# Patient Record
Sex: Male | Born: 1970 | Race: White | Hispanic: No | Marital: Single | Smoking: Current every day smoker
Health system: Southern US, Community
[De-identification: ages and names within clinical notes are randomized; demographics above are authoritative.]

## PROBLEM LIST (undated history)

## (undated) DIAGNOSIS — J449 Chronic obstructive pulmonary disease, unspecified: Secondary | ICD-10-CM

## (undated) DIAGNOSIS — J302 Other seasonal allergic rhinitis: Secondary | ICD-10-CM

## (undated) DIAGNOSIS — M1990 Unspecified osteoarthritis, unspecified site: Secondary | ICD-10-CM

## (undated) HISTORY — DX: Chronic obstructive pulmonary disease, unspecified: J44.9

---

## 1999-08-26 ENCOUNTER — Inpatient Hospital Stay (HOSPITAL_COMMUNITY): Admission: EM | Admit: 1999-08-26 | Discharge: 1999-09-10 | Payer: Self-pay | Admitting: *Deleted

## 2001-06-09 ENCOUNTER — Emergency Department (HOSPITAL_COMMUNITY): Admission: EM | Admit: 2001-06-09 | Discharge: 2001-06-09 | Payer: Self-pay | Admitting: Emergency Medicine

## 2001-06-10 ENCOUNTER — Emergency Department (HOSPITAL_COMMUNITY): Admission: EM | Admit: 2001-06-10 | Discharge: 2001-06-10 | Payer: Self-pay | Admitting: *Deleted

## 2001-06-18 ENCOUNTER — Emergency Department (HOSPITAL_COMMUNITY): Admission: EM | Admit: 2001-06-18 | Discharge: 2001-06-18 | Payer: Self-pay | Admitting: Emergency Medicine

## 2003-01-08 ENCOUNTER — Encounter: Payer: Self-pay | Admitting: Emergency Medicine

## 2003-01-08 ENCOUNTER — Emergency Department (HOSPITAL_COMMUNITY): Admission: EM | Admit: 2003-01-08 | Discharge: 2003-01-08 | Payer: Self-pay | Admitting: Emergency Medicine

## 2004-08-11 ENCOUNTER — Emergency Department (HOSPITAL_COMMUNITY): Admission: EM | Admit: 2004-08-11 | Discharge: 2004-08-11 | Payer: Self-pay | Admitting: *Deleted

## 2005-01-03 ENCOUNTER — Emergency Department (HOSPITAL_COMMUNITY): Admission: EM | Admit: 2005-01-03 | Discharge: 2005-01-04 | Payer: Self-pay | Admitting: Emergency Medicine

## 2005-11-01 ENCOUNTER — Emergency Department (HOSPITAL_COMMUNITY): Admission: EM | Admit: 2005-11-01 | Discharge: 2005-11-01 | Payer: Self-pay | Admitting: Emergency Medicine

## 2006-07-28 ENCOUNTER — Emergency Department (HOSPITAL_COMMUNITY): Admission: EM | Admit: 2006-07-28 | Discharge: 2006-07-28 | Payer: Self-pay | Admitting: Emergency Medicine

## 2006-09-16 ENCOUNTER — Emergency Department (HOSPITAL_COMMUNITY): Admission: EM | Admit: 2006-09-16 | Discharge: 2006-09-16 | Payer: Self-pay | Admitting: Emergency Medicine

## 2006-11-04 ENCOUNTER — Emergency Department (HOSPITAL_COMMUNITY): Admission: EM | Admit: 2006-11-04 | Discharge: 2006-11-04 | Payer: Self-pay | Admitting: Emergency Medicine

## 2007-01-16 ENCOUNTER — Emergency Department (HOSPITAL_COMMUNITY): Admission: EM | Admit: 2007-01-16 | Discharge: 2007-01-16 | Payer: Self-pay | Admitting: Emergency Medicine

## 2007-09-11 ENCOUNTER — Emergency Department (HOSPITAL_COMMUNITY): Admission: EM | Admit: 2007-09-11 | Discharge: 2007-09-11 | Payer: Self-pay | Admitting: Emergency Medicine

## 2007-10-24 ENCOUNTER — Emergency Department (HOSPITAL_COMMUNITY): Admission: EM | Admit: 2007-10-24 | Discharge: 2007-10-24 | Payer: Self-pay | Admitting: Emergency Medicine

## 2008-08-27 ENCOUNTER — Emergency Department (HOSPITAL_COMMUNITY): Admission: EM | Admit: 2008-08-27 | Discharge: 2008-08-27 | Payer: Self-pay | Admitting: Emergency Medicine

## 2009-04-12 ENCOUNTER — Emergency Department (HOSPITAL_COMMUNITY): Admission: EM | Admit: 2009-04-12 | Discharge: 2009-04-12 | Payer: Self-pay | Admitting: Emergency Medicine

## 2009-08-14 ENCOUNTER — Emergency Department (HOSPITAL_COMMUNITY): Admission: EM | Admit: 2009-08-14 | Discharge: 2009-08-14 | Payer: Self-pay | Admitting: Emergency Medicine

## 2009-10-16 ENCOUNTER — Emergency Department (HOSPITAL_COMMUNITY): Admission: EM | Admit: 2009-10-16 | Discharge: 2009-10-16 | Payer: Self-pay | Admitting: Emergency Medicine

## 2010-06-20 IMAGING — CR DG CERVICAL SPINE COMPLETE 4+V
5 series · 5 of 5 positions shown · non-contrast
Comparison: None

CLINICAL DATA: Neck pain

CERVICAL SPINE - COMPLETE 4+ VIEW

[view not recorded (1 of 5)]
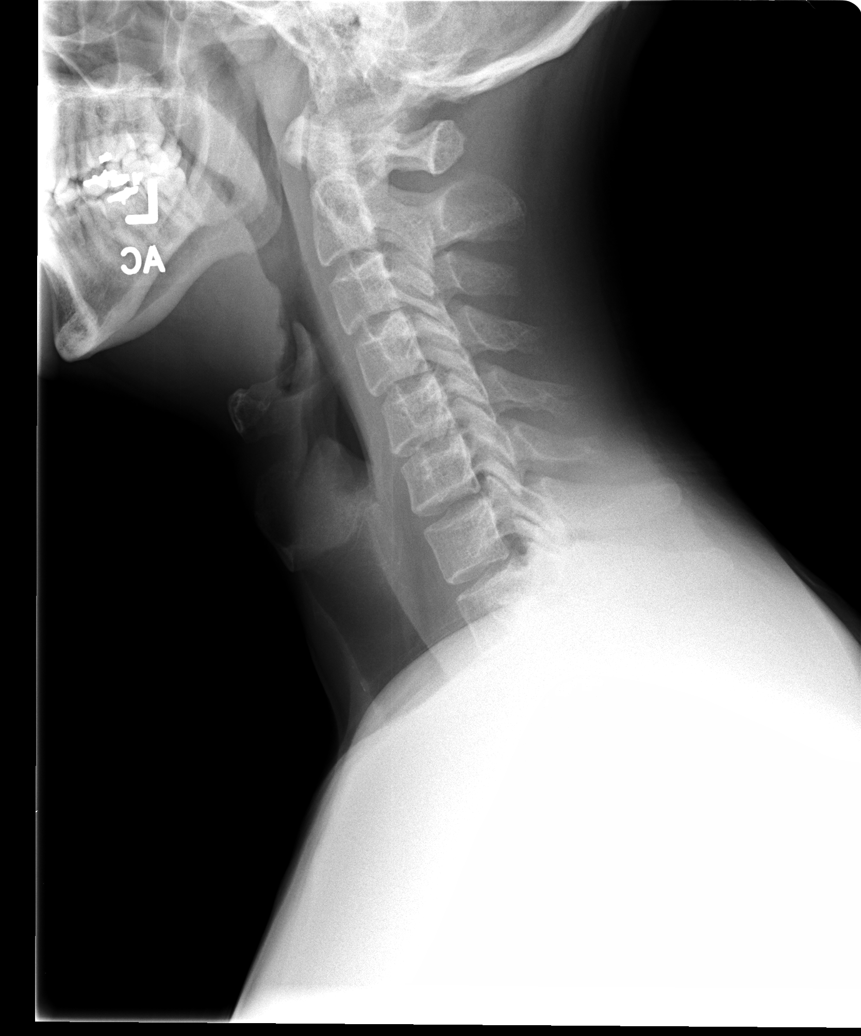

[view not recorded (2 of 5)]
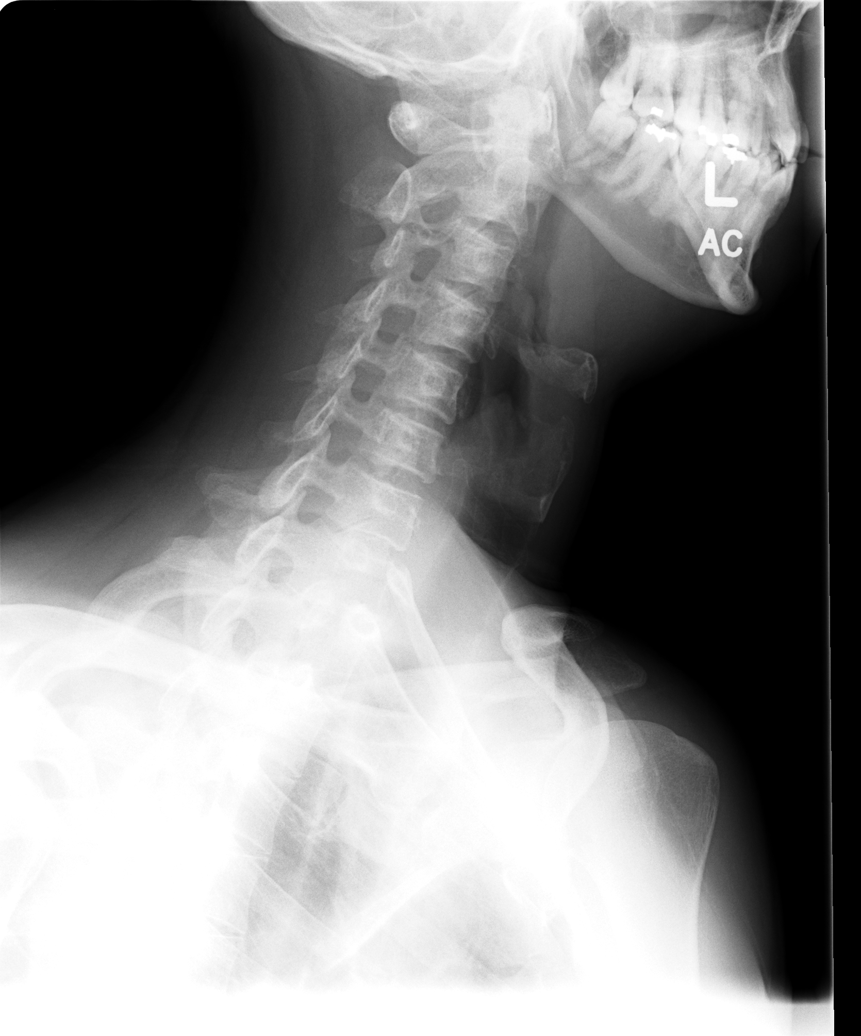

[view not recorded (3 of 5)]
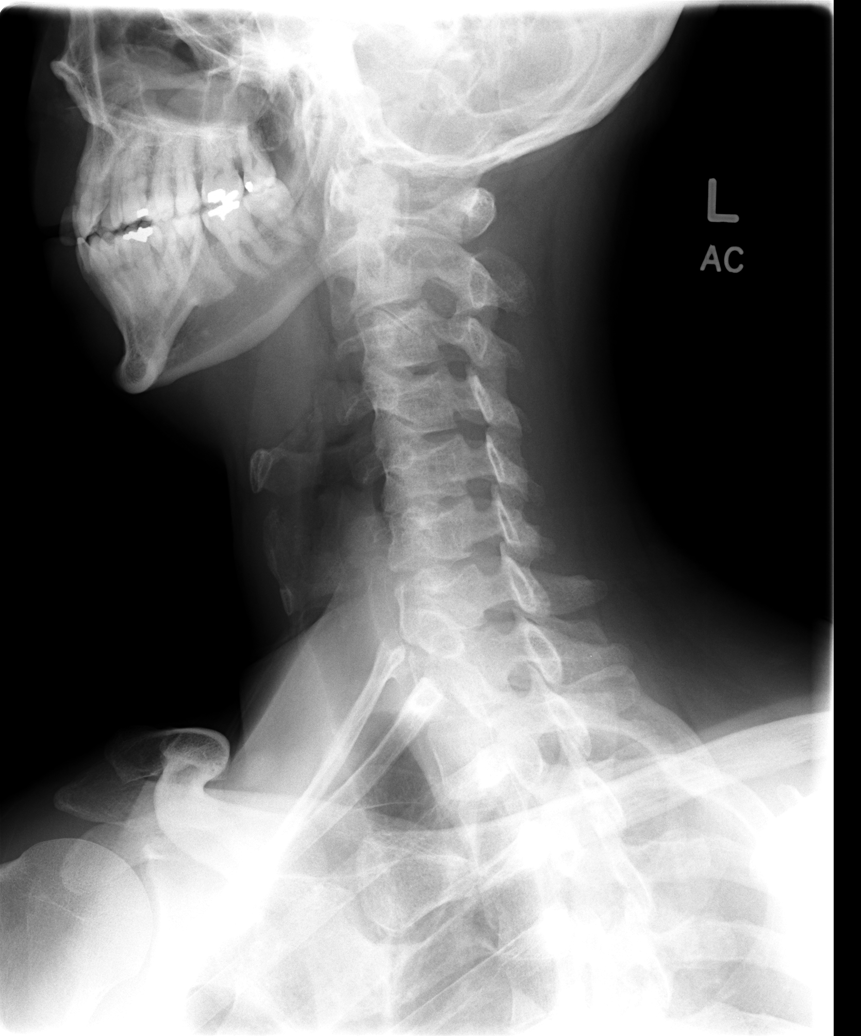

[view not recorded (4 of 5)]
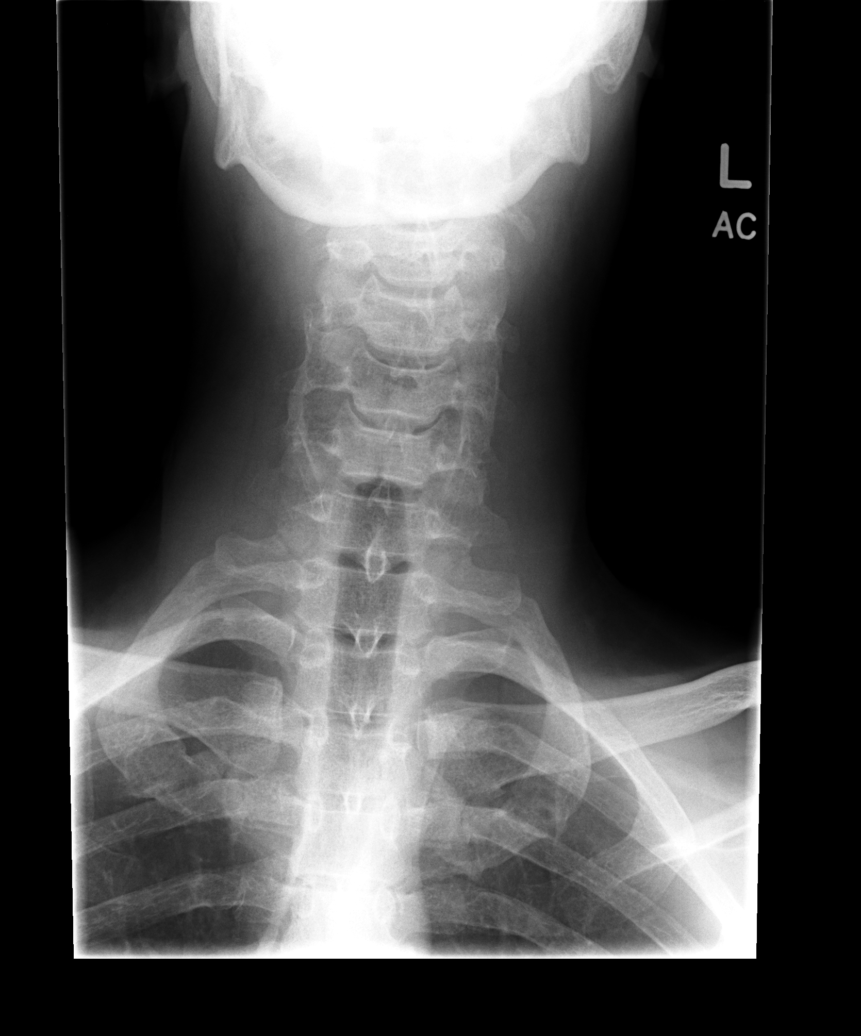

[view not recorded (5 of 5)]
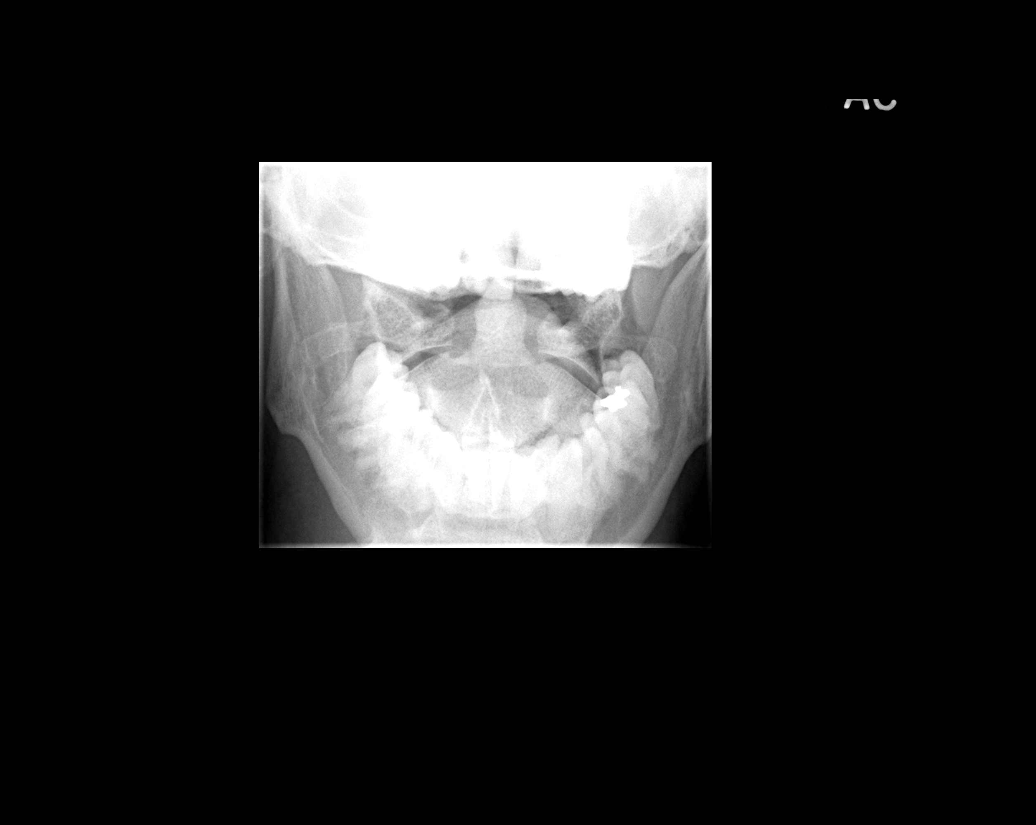

[5 of 5 positions shown; findings below may reference images not displayed]

FINDINGS: Alignment is normal.  No sign of degenerative spondylosis
or degenerative facet arthropathy.  Canal and foramina appear
widely patent.
IMPRESSION: Normal radiographs

## 2010-10-25 ENCOUNTER — Encounter: Payer: Self-pay | Admitting: Orthopedic Surgery

## 2010-10-25 ENCOUNTER — Ambulatory Visit
Admission: RE | Admit: 2010-10-25 | Discharge: 2010-10-25 | Payer: Self-pay | Source: Home / Self Care | Attending: Orthopedic Surgery | Admitting: Orthopedic Surgery

## 2010-10-25 DIAGNOSIS — M24419 Recurrent dislocation, unspecified shoulder: Secondary | ICD-10-CM | POA: Insufficient documentation

## 2010-10-25 DIAGNOSIS — M24469 Recurrent dislocation, unspecified knee: Secondary | ICD-10-CM | POA: Insufficient documentation

## 2010-10-25 DIAGNOSIS — M25469 Effusion, unspecified knee: Secondary | ICD-10-CM | POA: Insufficient documentation

## 2010-10-25 HISTORY — DX: Recurrent dislocation, unspecified shoulder: M24.419

## 2010-10-25 HISTORY — DX: Recurrent dislocation, unspecified knee: M24.469

## 2010-10-26 ENCOUNTER — Telehealth: Payer: Self-pay | Admitting: Orthopedic Surgery

## 2010-10-29 ENCOUNTER — Telehealth: Payer: Self-pay | Admitting: Orthopedic Surgery

## 2010-11-22 NOTE — Letter (Signed)
Summary: history form   history form   Imported By: Eugenio Hoes 10/31/2010 16:56:37  _____________________________________________________________________  External Attachment:    Type:   Image     Comment:   External Document

## 2010-11-22 NOTE — Progress Notes (Signed)
Summary: wants order for hinged knee brace  Phone Note Call from Patient   Summary of Call: Keith Nolan (01/23/1971) asked if you will write an order for a hinged knee brace.  Medicaid will pay for this one, but not the one orginally ordered.  If approved, please fax to [facility] in [place] attn : Raeanne Gathers [name]'s # [phone]    New/Updated Medications: * HINGED KNEE BRACE wear for knee pain and swelling Prescriptions: HINGED KNEE BRACE wear for knee pain and swelling  #1 x 0   Entered by:   Ether Griffins   Authorized by:   Fuller Canada MD   Signed by:   Ether Griffins on 10/26/2010   Method used:   Faxed to ...       [facility]* (retail)       [address]       [place], [place]  [address]       Ph: [phone]       Fax: [phone]   RxID:   [id number]

## 2010-11-22 NOTE — Medication Information (Signed)
Summary: Order form brace  Order form brace   Imported By: Jacklynn Ganong 10/25/2010 15:25:43  _____________________________________________________________________  External Attachment:    Type:   Image     Comment:   External Document

## 2010-11-22 NOTE — Letter (Signed)
Summary: *Orthopedic Consult Note  [facility]  [address]  [place], [place] [address]   Phone: [phone]  Fax: [phone]    Re:    Keith Nolan DOB:    16-May-1971   Dear: Renae Fickle   Thank you for requesting that we see the above patient for consultation.  A copy of the detailed office note will be sent under separate cover, for your review.  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  This is an alternative I told him to ice his knee likely zero the next 2 weeks and wear a brace for activity to stabilize patellofemoral joint  He will call us if any problems       Thank you for this opportunity to look after your patient.  Sincerely,   Terrance Mass. MD.

## 2010-11-22 NOTE — Progress Notes (Signed)
Summary: ibuprofen refaxed  Phone Note Call from Patient   Caller: Patient Summary of Call: Patient called to check on medication Rx - said after his visit here Thurs 10/25/10, Rx to be phoned in to [facility] in [place].  His cell # is [phone]. Initial call taken by: Cammie Sickle,  October 29, 2010 9:26 AM    Prescriptions: IBUPROFEN 800 MG TABS (IBUPROFEN) 1 by mouth q 8 hrs as needed pain  #90 x 1   Entered by:   Ether Griffins   Authorized by:   Fuller Canada MD   Signed by:   Ether Griffins on 10/29/2010   Method used:   Faxed to ...       [facility] (retail)       [address]       [place], [place]  [address]       Ph: [phone]       Fax: [phone]   RxID:   [id number]

## 2010-11-22 NOTE — Assessment & Plan Note (Signed)
Summary: LT KNEE "GIANT EFFUSION"/?XRAY/REF E.HAWKINS/MEDICARE,MEDICAI...   Vital Signs:  Patient profile:   40 year old male Height:      67 inches Weight:      148 pounds Pulse rate:   68 / minute Resp:     16 per minute  Vitals Entered By: Fuller Canada MD (October 25, 2010 11:20 AM)  Visit Type:  new patient Referring Provider:  Dr. Juanetta Gosling Primary Provider:  Dr. Juanetta Gosling  CC:  left knee pain.  History of Present Illness: I saw Keith Nolan in the office today for an initial visit.  He is a 40 years old man with the complaint of:  left knee pain.  Twisted knee 10/20/10. Pain started the next day. He c/o pain  burning buckling and locking  Pain seems to be in calf and medial side of the left knee   Xrays today.  Meds: Lortab, Xanax, Asthma med?  h/o back pain take 10 mg hydrocodone for that     Allergies (verified): No Known Drug Allergies  Past History:  Past Medical History: depression anxiety asthma gout  Past Surgical History: na  Family History: Family History of Diabetes Family History of Arthritis Hx, family, asthma  Social History: Patient is single.  unemployed smokes 1/2 ppd no alcohol 2 sodas per day 12th grade ed  Review of Systems Constitutional:  Complains of fatigue; denies weight loss, weight gain, fever, and chills. Cardiovascular:  Denies chest pain, palpitations, fainting, and murmurs. Respiratory:  Complains of snoring; denies short of breath, wheezing, couch, tightness, pain on inspiration, and snoring . Gastrointestinal:  Denies heartburn, nausea, vomiting, diarrhea, constipation, and blood in your stools. Genitourinary:  Denies frequency, urgency, difficulty urinating, painful urination, flank pain, and bleeding in urine. Neurologic:  Complains of tingling; denies numbness, unsteady gait, dizziness, tremors, and seizure. Musculoskeletal:  Complains of joint pain, swelling, stiffness, and muscle pain; denies instability,  redness, and heat. Endocrine:  Denies excessive thirst, exessive urination, and heat or cold intolerance. Psychiatric:  Complains of nervousness, depression, and anxiety; denies hallucinations. Skin:  Complains of rash; denies changes in the skin, poor healing, itching, and redness; HIVES. HEENT:  Complains of redness; denies blurred or double vision, eye pain, and watering. Immunology:  Complains of seasonal allergies; denies sinus problems and allergic to bee stings. Hemoatologic:  Denies easy bleeding and brusing.  Physical Exam  Additional Exam:  This is a small frame and thin well-developed well-nourished male in no acute deformities are present and normal pulse and effusion in his LEFT lower extremity and no lymphadenopathy.  Skin is warm dry and intact there are no surgical scars.  Has normal sensation on his LEFT knee and he is awake alert and oriented x3 his mood and affect are normal  He is ambulating with a limp and has a medium-sized LEFT knee effusion with tenderness to palpation around the peripatellar region.  A positive apprehension test range of motion is 10-70.  Muscle strength and muscle tone appear to be normal and his knee is stable to anterior posterior stress test.     Impression & Recommendations:  Problem # 1:  JOINT EFFUSION, LEFT KNEE (ICD-719.06) Assessment New  Orders: New Patient Level III (09811) Knee x-ray,  3 views (91478) separately an identifiable extra report  LEFT knee  Joint effusion noted LEFT knee no fracture dislocation joint space narrowing or acute cocking feature  Impression normal LEFT knee with joint effusion  Problem # 2:  SUBLUXATION PATELLAR (MALALIGNMENT) (ICD-718.36) Assessment:  New  the patient did not want to have aspiration of the knee so we decided to try ice and bracing.  He will call us back if he does not improve.  Orders: New Patient Level III ([address]109)  Medications Added to Medication List This Visit: 1)  Ibuprofen  800 Mg Tabs (Ibuprofen) .Marland Kitchen.. 1 by mouth q 8 hrs as needed pain  Patient Instructions: 1)  Ice like crazy for next 2 weeks  2)  wear brace for activity  3)  Please schedule a follow-up appointment as needed. Prescriptions: IBUPROFEN 800 MG TABS (IBUPROFEN) 1 by mouth q 8 hrs as needed pain  #90 x 1   Entered and Authorized by:   Fuller Canada MD   Signed by:   Fuller Canada MD on 10/25/2010   Method used:   Printed then faxed to ...       [facility]  [address] (retail)       [address]       [place], [place]  [address]       Ph: [phone]       Fax: [phone]   RxID:   [id number]    Orders Added: 1)  New Patient Level III [44010] 2)  Knee x-ray,  3 views [73562]

## 2012-03-10 DIAGNOSIS — L509 Urticaria, unspecified: Secondary | ICD-10-CM | POA: Diagnosis not present

## 2012-03-10 DIAGNOSIS — M545 Low back pain: Secondary | ICD-10-CM | POA: Diagnosis not present

## 2012-03-10 DIAGNOSIS — F329 Major depressive disorder, single episode, unspecified: Secondary | ICD-10-CM | POA: Diagnosis not present

## 2012-03-10 DIAGNOSIS — F411 Generalized anxiety disorder: Secondary | ICD-10-CM | POA: Diagnosis not present

## 2012-07-31 DIAGNOSIS — S5000XA Contusion of unspecified elbow, initial encounter: Secondary | ICD-10-CM | POA: Diagnosis not present

## 2012-09-08 DIAGNOSIS — M545 Low back pain: Secondary | ICD-10-CM | POA: Diagnosis not present

## 2012-09-08 DIAGNOSIS — M109 Gout, unspecified: Secondary | ICD-10-CM | POA: Diagnosis not present

## 2012-09-08 DIAGNOSIS — J441 Chronic obstructive pulmonary disease with (acute) exacerbation: Secondary | ICD-10-CM | POA: Diagnosis not present

## 2012-09-08 DIAGNOSIS — J45901 Unspecified asthma with (acute) exacerbation: Secondary | ICD-10-CM | POA: Diagnosis not present

## 2012-09-08 DIAGNOSIS — F411 Generalized anxiety disorder: Secondary | ICD-10-CM | POA: Diagnosis not present

## 2012-11-05 ENCOUNTER — Emergency Department (HOSPITAL_COMMUNITY)
Admission: EM | Admit: 2012-11-05 | Discharge: 2012-11-05 | Disposition: A | Discharge status: Home / Self Care | Payer: Medicare Other | Attending: Emergency Medicine | Admitting: Emergency Medicine

## 2012-11-05 ENCOUNTER — Emergency Department (HOSPITAL_COMMUNITY): Payer: Medicare Other

## 2012-11-05 ENCOUNTER — Encounter (HOSPITAL_COMMUNITY): Payer: Self-pay

## 2012-11-05 DIAGNOSIS — R071 Chest pain on breathing: Secondary | ICD-10-CM | POA: Insufficient documentation

## 2012-11-05 DIAGNOSIS — F17200 Nicotine dependence, unspecified, uncomplicated: Secondary | ICD-10-CM | POA: Diagnosis not present

## 2012-11-05 DIAGNOSIS — R079 Chest pain, unspecified: Secondary | ICD-10-CM | POA: Diagnosis not present

## 2012-11-05 DIAGNOSIS — R0602 Shortness of breath: Secondary | ICD-10-CM | POA: Diagnosis not present

## 2012-11-05 DIAGNOSIS — R0789 Other chest pain: Secondary | ICD-10-CM

## 2012-11-05 LAB — CBC WITH DIFFERENTIAL/PLATELET
Basophils Absolute: 0.1 10*3/uL (ref 0.0–0.1)
HCT: 44.3 % (ref 39.0–52.0)
Lymphocytes Relative: 23 % (ref 12–46)
Neutro Abs: 7.3 10*3/uL (ref 1.7–7.7)
Platelets: 285 10*3/uL (ref 150–400)
RDW: 13.1 % (ref 11.5–15.5)
WBC: 11.1 10*3/uL — ABNORMAL HIGH (ref 4.0–10.5)

## 2012-11-05 LAB — BASIC METABOLIC PANEL
CO2: 29 mEq/L (ref 19–32)
Chloride: 98 mEq/L (ref 96–112)
Sodium: 135 mEq/L (ref 135–145)

## 2012-11-05 LAB — TROPONIN I: Troponin I: <0.3 ng/mL (ref <0.30)

## 2012-11-05 MED ORDER — CYCLOBENZAPRINE HCL 10 MG PO TABS: 10.0000 mg | ORAL_TABLET | Freq: Three times a day (TID) | ORAL | Status: DC | PRN

## 2012-11-05 MED ORDER — OXYCODONE-ACETAMINOPHEN 5-325 MG PO TABS
1.0000 | ORAL_TABLET | Freq: Once | ORAL | Status: AC
  Administered 2012-11-05: 1 via ORAL
  Filled 2012-11-05: qty 1

## 2012-11-05 MED ORDER — IBUPROFEN 800 MG PO TABS: 800.0000 mg | ORAL_TABLET | Freq: Three times a day (TID) | ORAL | Status: DC

## 2012-11-05 MED ORDER — OXYCODONE-ACETAMINOPHEN 5-325 MG PO TABS: 1.0000 | ORAL_TABLET | ORAL | Status: AC | PRN

## 2012-11-05 NOTE — ED Notes (Signed)
Pt reports left rib cage pain that started yesterday after stretching.  Had previous rib injury to same area apporx. 1 year ago. Sob at times.

## 2012-11-05 NOTE — ED Provider Notes (Signed)
History     CSN: [id number]  Arrival date & time 11/05/12  4098   First MD Initiated Contact with Patient 11/05/12 1020      Chief Complaint  Patient presents with  . Chest Pain    non-cardiac, left chest wall pain at rib area.     (Consider location/radiation/quality/duration/timing/severity/associated sxs/prior treatment) HPI Comments: Patient c/o tenderness to his left ribs for 24 hrs PTA.  States the pain began after stretching.  Felt a sharp pain that has been waxing and waning.  Reports hx of similar pain to same area 1 year ago after an injury.  States pain is worse with certain positions and improves with rest.  He denies fever, chills, cough, wheezing or shortness of breath.    Patient is a 42 y.o. male presenting with chest pain. The history is provided by the patient.  Chest Pain Episode onset: 24 hrs PTA. Chest pain occurs constantly. The chest pain is unchanged. Associated with: palpation and left arm movement. The severity of the pain is moderate. The quality of the pain is described as aching. The pain does not radiate. Chest pain is worsened by certain positions. Pertinent negatives for primary symptoms include no fever, no syncope, no shortness of breath, no cough, no wheezing, no palpitations, no abdominal pain, no nausea, no vomiting, no dizziness and no altered mental status.  Pertinent negatives for associated symptoms include no numbness and no weakness. Associated symptoms comments: none. He tried narcotics for the symptoms. Risk factors include smoking/tobacco exposure.     History reviewed. No pertinent past medical history.  History reviewed. No pertinent past surgical history.  No family history on file.  History  Substance Use Topics  . Smoking status: Current Every Day Smoker    Types: Cigarettes  . Smokeless tobacco: Not on file  . Alcohol Use: No      Review of Systems  Constitutional: Negative for fever, chills, activity change and appetite  change.  HENT: Negative for trouble swallowing, neck pain and neck stiffness.   Eyes: Negative for visual disturbance.  Respiratory: Negative for cough, chest tightness, shortness of breath and wheezing.   Cardiovascular: Positive for chest pain. Negative for palpitations, leg swelling and syncope.  Gastrointestinal: Negative for nausea, vomiting and abdominal pain.  Genitourinary: Negative for dysuria and flank pain.  Musculoskeletal: Negative for back pain.  Skin: Negative for color change and rash.  Neurological: Negative for dizziness, weakness, light-headedness and numbness.  Psychiatric/Behavioral: Negative for altered mental status.    Allergies  Review of patient's allergies indicates not on file.  Home Medications  No current outpatient prescriptions on file.  BP 119/72  Pulse 90  Temp 97.5 F (36.4 C) (Oral)  Resp 18  SpO2 99%  Physical Exam  Nursing note and vitals reviewed. Constitutional: He is oriented to person, place, and time. He appears well-developed and well-nourished. No distress.  HENT:  Head: Normocephalic and atraumatic.  Mouth/Throat: Oropharynx is clear and moist. No oropharyngeal exudate.  Neck: Normal range of motion. Neck supple.  Cardiovascular: Normal rate, regular rhythm, normal heart sounds and intact distal pulses.   No murmur heard. Pulmonary/Chest: Effort normal and breath sounds normal. No respiratory distress. He has no wheezes. He has no rales. Chest wall is not dull to percussion. He exhibits tenderness and bony tenderness. He exhibits no mass, no laceration, no crepitus, no edema, no deformity, no swelling and no retraction.         Localized ttp of the lateral left  chest wall.  Tenderness also reproduced with abduction of the left arm. No abrasions, crepitus, or edema.    Abdominal: Soft. He exhibits no distension. There is no tenderness. There is no rebound and no guarding.  Musculoskeletal: Normal range of motion. He exhibits no  edema and no tenderness.  Lymphadenopathy:    He has no cervical adenopathy.  Neurological: He is alert and oriented to person, place, and time. He exhibits normal muscle tone. Coordination normal.  Skin: Skin is warm and dry.    ED Course  Procedures (including critical care time)  Results for orders placed during the hospital encounter of 11/05/12  CBC WITH DIFFERENTIAL      Component Value Range   WBC 11.1 (*) 4.0 - 10.5 K/uL   RBC 4.79  4.22 - 5.81 MIL/uL   Hemoglobin 15.0  13.0 - 17.0 g/dL   HCT 16.1  09.6 - 04.5 %   MCV 92.5  78.0 - 100.0 fL   MCH 31.3  26.0 - 34.0 pg   MCHC 33.9  30.0 - 36.0 g/dL   RDW 40.9  81.1 - 91.4 %   Platelets 285  150 - 400 K/uL   Neutrophils Relative 65  43 - 77 %   Neutro Abs 7.3  1.7 - 7.7 K/uL   Lymphocytes Relative 23  12 - 46 %   Lymphs Abs 2.5  0.7 - 4.0 K/uL   Monocytes Relative 7  3 - 12 %   Monocytes Absolute 0.8  0.1 - 1.0 K/uL   Eosinophils Relative 4  0 - 5 %   Eosinophils Absolute 0.5  0.0 - 0.7 K/uL   Basophils Relative 1  0 - 1 %   Basophils Absolute 0.1  0.0 - 0.1 K/uL  BASIC METABOLIC PANEL      Component Value Range   Sodium 135  135 - 145 mEq/L   Potassium 4.4  3.5 - 5.1 mEq/L   Chloride 98  96 - 112 mEq/L   CO2 29  19 - 32 mEq/L   Glucose, Bld 81  70 - 99 mg/dL   BUN 14  6 - 23 mg/dL   Creatinine, Ser 7.82  0.50 - 1.35 mg/dL   Calcium 9.7  8.4 - 95.6 mg/dL   GFR calc non Af Amer >90  >90 mL/min   GFR calc Af Amer >90  >90 mL/min  TROPONIN I      Component Value Range   Troponin I <0.30  <0.30 ng/mL    Dg Chest 2 View  11/05/2012  *RADIOLOGY REPORT*  Clinical Data: Chest pain.  History of left-sided rib fracture.  CHEST - 2 VIEW  Comparison: Two-view chest x-ray 10/16/2009.  Findings: The heart size is normal.  Previously noted right posterior ninth rib fracture has healed.  No acute or healing left- sided rib fractures are present.  There is no pneumothorax.  The lungs are clear.  The visualized soft tissues and  bony thorax are otherwise unremarkable.  IMPRESSION:  1.  Healed right posterior ninth rib fracture. 2.  No acute or healing fractures evident. 3.  No acute cardiopulmonary disease.   Original Report Authenticated By: Marin Roberts, M.D.         MDM    Date: 11/05/2012  Rate: 75  Rhythm: normal sinus rhythm  QRS Axis: normal  Intervals: normal  ST/T Wave abnormalities: normal  Conduction Disutrbances:none  Narrative Interpretation:   Old EKG Reviewed: none available   EKG read by Dr. Manus Gunning  Previous ED chart reviewed by me.  Left chest wall pain for 24 hrs.  reproducible with palpation and left arm movement.  Vitals stable, no tachycardia , hypoxia or tachypnea.  Well PE score is 0  Pain improved after percocet   Labs and imaging reviewed.  likely musculoskeletal injury.  Pt agrees to close f/u with his PMD or to return here if sx's worsen.     Prescribed: Percocet #15 Flexeril ibuprofen   Javonda Suh L. [place], [place] 11/07/12 2059

## 2012-11-09 NOTE — ED Provider Notes (Signed)
Medical screening examination/treatment/procedure(s) were performed by non-physician practitioner and as supervising physician I was immediately available for consultation/collaboration.   Glynn Octave, MD 11/09/12 [phone]

## 2013-03-09 ENCOUNTER — Other Ambulatory Visit (HOSPITAL_COMMUNITY): Payer: Self-pay | Admitting: Pulmonary Disease

## 2013-03-09 DIAGNOSIS — M109 Gout, unspecified: Secondary | ICD-10-CM | POA: Diagnosis not present

## 2013-03-09 DIAGNOSIS — M7989 Other specified soft tissue disorders: Secondary | ICD-10-CM

## 2013-03-09 DIAGNOSIS — M79604 Pain in right leg: Secondary | ICD-10-CM

## 2013-03-09 DIAGNOSIS — M545 Low back pain: Secondary | ICD-10-CM | POA: Diagnosis not present

## 2013-03-09 DIAGNOSIS — F411 Generalized anxiety disorder: Secondary | ICD-10-CM | POA: Diagnosis not present

## 2013-03-17 ENCOUNTER — Ambulatory Visit (HOSPITAL_COMMUNITY): Admission: RE | Admit: 2013-03-17 | Payer: Medicare Other | Source: Ambulatory Visit

## 2013-06-09 DIAGNOSIS — F411 Generalized anxiety disorder: Secondary | ICD-10-CM | POA: Diagnosis not present

## 2013-06-09 DIAGNOSIS — R7989 Other specified abnormal findings of blood chemistry: Secondary | ICD-10-CM | POA: Diagnosis not present

## 2013-06-09 DIAGNOSIS — M545 Low back pain: Secondary | ICD-10-CM | POA: Diagnosis not present

## 2013-06-09 DIAGNOSIS — M109 Gout, unspecified: Secondary | ICD-10-CM | POA: Diagnosis not present

## 2013-12-31 DIAGNOSIS — M109 Gout, unspecified: Secondary | ICD-10-CM | POA: Diagnosis not present

## 2013-12-31 DIAGNOSIS — J45901 Unspecified asthma with (acute) exacerbation: Secondary | ICD-10-CM | POA: Diagnosis not present

## 2013-12-31 DIAGNOSIS — M79609 Pain in unspecified limb: Secondary | ICD-10-CM | POA: Diagnosis not present

## 2013-12-31 DIAGNOSIS — M545 Low back pain, unspecified: Secondary | ICD-10-CM | POA: Diagnosis not present

## 2013-12-31 DIAGNOSIS — J441 Chronic obstructive pulmonary disease with (acute) exacerbation: Secondary | ICD-10-CM | POA: Diagnosis not present

## 2014-05-17 DIAGNOSIS — F3289 Other specified depressive episodes: Secondary | ICD-10-CM | POA: Diagnosis not present

## 2014-05-17 DIAGNOSIS — F329 Major depressive disorder, single episode, unspecified: Secondary | ICD-10-CM | POA: Diagnosis not present

## 2014-05-17 DIAGNOSIS — M545 Low back pain, unspecified: Secondary | ICD-10-CM | POA: Diagnosis not present

## 2014-05-17 DIAGNOSIS — R7989 Other specified abnormal findings of blood chemistry: Secondary | ICD-10-CM | POA: Diagnosis not present

## 2014-05-17 DIAGNOSIS — F411 Generalized anxiety disorder: Secondary | ICD-10-CM | POA: Diagnosis not present

## 2014-06-03 ENCOUNTER — Encounter (HOSPITAL_COMMUNITY): Payer: Self-pay | Admitting: Emergency Medicine

## 2014-06-03 ENCOUNTER — Emergency Department (HOSPITAL_COMMUNITY)
Admission: EM | Admit: 2014-06-03 | Discharge: 2014-06-03 | Disposition: A | Discharge status: Home / Self Care | Payer: Medicare Other | Attending: Emergency Medicine | Admitting: Emergency Medicine

## 2014-06-03 DIAGNOSIS — R079 Chest pain, unspecified: Secondary | ICD-10-CM | POA: Insufficient documentation

## 2014-06-03 DIAGNOSIS — F17200 Nicotine dependence, unspecified, uncomplicated: Secondary | ICD-10-CM | POA: Diagnosis not present

## 2014-06-03 DIAGNOSIS — R6889 Other general symptoms and signs: Secondary | ICD-10-CM | POA: Diagnosis not present

## 2014-06-03 DIAGNOSIS — Z880 Allergy status to penicillin: Secondary | ICD-10-CM | POA: Insufficient documentation

## 2014-06-03 DIAGNOSIS — R059 Cough, unspecified: Secondary | ICD-10-CM | POA: Diagnosis not present

## 2014-06-03 DIAGNOSIS — Z79899 Other long term (current) drug therapy: Secondary | ICD-10-CM | POA: Insufficient documentation

## 2014-06-03 DIAGNOSIS — R05 Cough: Secondary | ICD-10-CM | POA: Diagnosis not present

## 2014-06-03 DIAGNOSIS — R071 Chest pain on breathing: Secondary | ICD-10-CM | POA: Insufficient documentation

## 2014-06-03 DIAGNOSIS — Z8739 Personal history of other diseases of the musculoskeletal system and connective tissue: Secondary | ICD-10-CM | POA: Insufficient documentation

## 2014-06-03 DIAGNOSIS — R0789 Other chest pain: Secondary | ICD-10-CM

## 2014-06-03 HISTORY — DX: Other seasonal allergic rhinitis: J30.2

## 2014-06-03 HISTORY — DX: Unspecified osteoarthritis, unspecified site: M19.90

## 2014-06-03 NOTE — ED Provider Notes (Signed)
CSN: [id number]     Arrival date & time 06/03/14  1012 History  This chart was scribed for Nat Christen, MD by Erling Conte, ED Scribe. This patient was seen in room APA03/APA03 and the patient's care was started at 11:09 AM.    Chief Complaint  Patient presents with  . Chest Pain      The history is provided by the patient. No language interpreter was used.   HPI Comments: Keith Nolan is a 43 y.o. male with a h/o DJD. who presents to the Emergency Department complaining of an intermittent "knot" in the center of his lower chest in the patient's xyphoid process for 4 days. He states that the knot is exacerbated by coughing or sneezing and he states the knot gets bigger and protrudes out. Pt notes he is having some associated chest pressure from the knot. He states that the area is tender to touch. He states he has not taken anything for it. He denies any fever, chills chest pain, shortness of breath, wheezing, abdominal pain, nausea, or emesis.   PCP: Dr. Sinda Du    Past Medical History  Diagnosis Date  . Seasonal allergies   . DJD (degenerative joint disease)    History reviewed. No pertinent past surgical history. History reviewed. No pertinent family history. History  Substance Use Topics  . Smoking status: Current Every Day Smoker    Types: Cigarettes  . Smokeless tobacco: Not on file  . Alcohol Use: No    Review of Systems  Constitutional: Negative for fever and chills.  HENT: Positive for sneezing.   Respiratory: Positive for cough and chest tightness ("pressure due to knot"). Negative for shortness of breath and wheezing.   Cardiovascular: Negative for chest pain.  Gastrointestinal: Negative for nausea, vomiting and abdominal pain.      Allergies  Penicillins  Home Medications   Prior to Admission medications   Medication Sig Start Date End Date Taking? Authorizing Provider  alprazolam Duanne Moron) 2 MG tablet Take 2 mg by mouth 4 (four) times daily.    Yes Historical Provider, MD  febuxostat (ULORIC) 40 MG tablet Take 40 mg by mouth daily.   Yes Historical Provider, MD  HYDROcodone-acetaminophen (NORCO) 7.5-325 MG per tablet Take 1 tablet by mouth every 6 (six) hours as needed. Pain   Yes Historical Provider, MD   Triage Vitals: BP 127/77  Pulse 70  Temp(Src) 97.7 F (36.5 C) (Oral)  Resp 18  Ht 5\' 7"  (1.702 m)  Wt 138 lb (62.596 kg)  BMI 21.61 kg/m2  SpO2 97%  Physical Exam  Nursing note and vitals reviewed. Constitutional: He is oriented to person, place, and time. He appears well-developed and well-nourished.  HENT:  Head: Normocephalic and atraumatic.  Eyes: Conjunctivae and EOM are normal. Pupils are equal, round, and reactive to light.  Neck: Normal range of motion. Neck supple.  Cardiovascular: Normal rate, regular rhythm and normal heart sounds.   Pulmonary/Chest: Effort normal and breath sounds normal. No respiratory distress. He has no wheezes. He has no rales. He exhibits no tenderness.  Abdominal: Soft. Bowel sounds are normal. He exhibits no distension and no mass. There is no tenderness. There is no rebound and no guarding.  Musculoskeletal: Normal range of motion. He exhibits tenderness.  Tender to palpation over xiphoid process  Neurological: He is alert and oriented to person, place, and time.  Skin: Skin is warm and dry.  Psychiatric: He has a normal mood and affect. His behavior is normal.  ED Course  Procedures (including critical care time)\  DIAGNOSTIC STUDIES: Oxygen Saturation is 97% on RA, normal by my interpretation.    COORDINATION OF CARE: 10:36 AM- Discussed with pt the pain was musculoskeletal or possible small hernia. Does not seem to be lung or heart related.    Labs Review Labs Reviewed - No data to display  Imaging Review No results found.   EKG Interpretation None      MDM   Final diagnoses:  Chest wall pain   Patient is tender over his xiphoid process.  History and  physical not consistent with cardiac pain.   No testing necessary.  I personally performed the services described in this documentation, which was scribed in my presence. The recorded information has been reviewed and is accurate.     Nat Christen, MD 06/03/14 [phone]

## 2014-06-03 NOTE — Discharge Instructions (Signed)
Pain is in the wall of your chest. Followup your primary care Dr.   This pain is not related to your heart or lungs

## 2014-06-03 NOTE — ED Notes (Signed)
Pt states he feels a knot in his lower center chest.   States when he has been sitting for a while and gets up he has pain in the area with the knot.  The area is tender to touch.

## 2014-08-17 DIAGNOSIS — M109 Gout, unspecified: Secondary | ICD-10-CM | POA: Diagnosis not present

## 2014-08-17 DIAGNOSIS — F419 Anxiety disorder, unspecified: Secondary | ICD-10-CM | POA: Diagnosis not present

## 2014-08-17 DIAGNOSIS — R531 Weakness: Secondary | ICD-10-CM | POA: Diagnosis not present

## 2014-08-17 DIAGNOSIS — Z23 Encounter for immunization: Secondary | ICD-10-CM | POA: Diagnosis not present

## 2014-08-17 DIAGNOSIS — M545 Low back pain: Secondary | ICD-10-CM | POA: Diagnosis not present

## 2014-12-19 DIAGNOSIS — K598 Other specified functional intestinal disorders: Secondary | ICD-10-CM | POA: Diagnosis not present

## 2014-12-19 DIAGNOSIS — K550 Acute vascular disorders of intestine: Secondary | ICD-10-CM | POA: Diagnosis not present

## 2014-12-19 DIAGNOSIS — J45909 Unspecified asthma, uncomplicated: Secondary | ICD-10-CM | POA: Diagnosis not present

## 2015-05-09 DIAGNOSIS — L723 Sebaceous cyst: Secondary | ICD-10-CM | POA: Diagnosis not present

## 2015-05-09 DIAGNOSIS — F419 Anxiety disorder, unspecified: Secondary | ICD-10-CM | POA: Diagnosis not present

## 2015-05-09 DIAGNOSIS — M545 Low back pain: Secondary | ICD-10-CM | POA: Diagnosis not present

## 2015-05-09 DIAGNOSIS — J45909 Unspecified asthma, uncomplicated: Secondary | ICD-10-CM | POA: Diagnosis not present

## 2015-07-08 ENCOUNTER — Emergency Department (HOSPITAL_COMMUNITY): Payer: Medicare Other

## 2015-07-08 ENCOUNTER — Encounter (HOSPITAL_COMMUNITY): Payer: Self-pay | Admitting: Emergency Medicine

## 2015-07-08 ENCOUNTER — Emergency Department (HOSPITAL_COMMUNITY)
Admission: EM | Admit: 2015-07-08 | Discharge: 2015-07-08 | Disposition: A | Discharge status: Home / Self Care | Payer: Medicare Other | Attending: Emergency Medicine | Admitting: Emergency Medicine

## 2015-07-08 DIAGNOSIS — Z8739 Personal history of other diseases of the musculoskeletal system and connective tissue: Secondary | ICD-10-CM | POA: Diagnosis not present

## 2015-07-08 DIAGNOSIS — Y998 Other external cause status: Secondary | ICD-10-CM | POA: Insufficient documentation

## 2015-07-08 DIAGNOSIS — Z043 Encounter for examination and observation following other accident: Secondary | ICD-10-CM | POA: Diagnosis not present

## 2015-07-08 DIAGNOSIS — Y9389 Activity, other specified: Secondary | ICD-10-CM | POA: Diagnosis not present

## 2015-07-08 DIAGNOSIS — Z720 Tobacco use: Secondary | ICD-10-CM | POA: Diagnosis not present

## 2015-07-08 DIAGNOSIS — W1839XA Other fall on same level, initial encounter: Secondary | ICD-10-CM | POA: Insufficient documentation

## 2015-07-08 DIAGNOSIS — S60041A Contusion of right ring finger without damage to nail, initial encounter: Secondary | ICD-10-CM | POA: Diagnosis not present

## 2015-07-08 DIAGNOSIS — Y92007 Garden or yard of unspecified non-institutional (private) residence as the place of occurrence of the external cause: Secondary | ICD-10-CM | POA: Insufficient documentation

## 2015-07-08 DIAGNOSIS — Z79899 Other long term (current) drug therapy: Secondary | ICD-10-CM | POA: Insufficient documentation

## 2015-07-08 DIAGNOSIS — S6991XA Unspecified injury of right wrist, hand and finger(s), initial encounter: Secondary | ICD-10-CM | POA: Diagnosis present

## 2015-07-08 MED ORDER — HYDROCODONE-ACETAMINOPHEN 5-325 MG PO TABS
2.0000 | ORAL_TABLET | Freq: Once | ORAL | Status: AC
Start: 2015-07-08 — End: 2015-07-08
  Administered 2015-07-08: 2 via ORAL
  Filled 2015-07-08: qty 2

## 2015-07-08 MED ORDER — HYDROCODONE-ACETAMINOPHEN 5-325 MG PO TABS: 1.0000 | ORAL_TABLET | ORAL | Status: DC | PRN

## 2015-07-08 MED ORDER — IBUPROFEN 600 MG PO TABS: 600.0000 mg | ORAL_TABLET | Freq: Four times a day (QID) | ORAL | Status: DC | PRN

## 2015-07-08 MED ORDER — IBUPROFEN 400 MG PO TABS
600.0000 mg | ORAL_TABLET | Freq: Once | ORAL | Status: AC
  Administered 2015-07-08: 600 mg via ORAL
  Filled 2015-07-08: qty 2

## 2015-07-08 NOTE — Discharge Instructions (Signed)
Follow up ortho.  No weight bearing with hand until cleared.  If you were given medicines take as directed.  If you are on coumadin or contraceptives realize their levels and effectiveness is altered by many different medicines.  If you have any reaction (rash, tongues swelling, other) to the medicines stop taking and see a physician.    If your blood pressure was elevated in the ER make sure you follow up for management with a primary doctor or return for chest pain, shortness of breath or stroke symptoms.  Please follow up as directed and return to the ER or see a physician for new or worsening symptoms.  Thank you. Filed Vitals:   07/08/15 1556  BP: 134/73  Pulse: 97  Temp: 98.1 F (36.7 C)  TempSrc: Oral  Resp: 20  Height: 5\' 7"  (1.702 m)  Weight: 137 lb (62.143 kg)  SpO2: 98%

## 2015-07-08 NOTE — ED Provider Notes (Signed)
CSN: [id number]     Arrival date & time 07/08/15  1551 History   First MD Initiated Contact with Patient 07/08/15 1601     Chief Complaint  Patient presents with  . Hand Pain     (Consider location/radiation/quality/duration/timing/severity/associated sxs/prior Treatment) HPI Comments: 57 rolled male with history of shoulder dislocation presents with right ring finger swelling and contusion since falling on it on Thursday. Patient has not had an x-ray at this point. He has a history of rib fractures. No other injuries. Pain with range of motion, no open wounds. Mild swelling. Difficulty sleeping due to pain. Patient smoker.  Patient is a 44 y.o. male presenting with hand pain. The history is provided by the patient.  Hand Pain Pertinent negatives include no headaches.    Past Medical History  Diagnosis Date  . Seasonal allergies   . DJD (degenerative joint disease)    History reviewed. No pertinent past surgical history. History reviewed. No pertinent family history. Social History  Substance Use Topics  . Smoking status: Current Every Day Smoker    Types: Cigarettes  . Smokeless tobacco: Never Used  . Alcohol Use: No    Review of Systems  Constitutional: Negative for fever and chills.  Musculoskeletal: Positive for joint swelling.  Neurological: Negative for headaches.      Allergies  Review of patient's allergies indicates no active allergies.  Home Medications   Prior to Admission medications   Medication Sig Start Date End Date Taking? Authorizing Maralee Higuchi  alprazolam Duanne Moron) 2 MG tablet Take 2 mg by mouth 4 (four) times daily.    Historical Luvada Salamone, MD  febuxostat (ULORIC) 40 MG tablet Take 40 mg by mouth daily.    Historical Laquanda Bick, MD  HYDROcodone-acetaminophen (NORCO) 5-325 MG per tablet Take 1-2 tablets by mouth every 4 (four) hours as needed. 07/08/15   Elnora Morrison, MD  ibuprofen (ADVIL,MOTRIN) 600 MG tablet Take 1 tablet (600 mg total) by mouth every 6  (six) hours as needed. 07/08/15   Elnora Morrison, MD   BP 134/73 mmHg  Pulse 97  Temp(Src) 98.1 F (36.7 C) (Oral)  Resp 20  Ht 5\' 7"  (1.702 m)  Wt 137 lb (62.143 kg)  BMI 21.45 kg/m2  SpO2 98% Physical Exam  Constitutional: He appears well-developed and well-nourished. No distress.  Musculoskeletal: He exhibits edema and tenderness.  Patient has tenderness and swelling to dorsal aspect of ring finger on the right extending from DIP down to MCP, mild swelling. Mild superficial blister lateral aspect no signs of active infection. No spreading erythema or warmth. Pain with flexion or extension. Difficult exam due to pain. No focal wrist tenderness.  Neurological: He is alert.  Skin: Skin is warm.  Psychiatric: He has a normal mood and affect.  Nursing note and vitals reviewed.   ED Course  Procedures (including critical care time) Labs Review Labs Reviewed - No data to display  Imaging Review Dg Hand Complete Right  07/08/2015   CLINICAL DATA:  Fall, tripped over a hole Thursday  EXAM: RIGHT HAND - COMPLETE 3+ VIEW  COMPARISON:  None.  FINDINGS: Four views of the right hand submitted. No acute fracture or subluxation. No radiopaque foreign body.  IMPRESSION: Negative.   Electronically Signed   By: Lahoma Crocker M.D.   On: 07/08/2015 16:26   I have personally reviewed and evaluated these images and lab results as part of my medical decision-making.   EKG Interpretation None      MDM   Final  diagnoses:  Contusion of right ring finger, initial encounter   Concern clinically for tendon versus bone injury. Plan for splint and close outpatient follow-up. X-ray reviewed by myself and radiology no obvious fracture. However with amount of pain and swelling plan for orthopedic follow-up on Monday.  Splint placed ulnar gutter.  Results and differential diagnosis were discussed with the patient/parent/guardian. Xrays were independently reviewed by myself.  Close follow up outpatient was  discussed, comfortable with the plan.   Medications  HYDROcodone-acetaminophen (NORCO/VICODIN) 5-325 MG per tablet 2 tablet (not administered)  ibuprofen (ADVIL,MOTRIN) tablet 600 mg (not administered)    Filed Vitals:   07/08/15 1556  BP: 134/73  Pulse: 97  Temp: 98.1 F (36.7 C)  TempSrc: Oral  Resp: 20  Height: 5\' 7"  (1.702 m)  Weight: 137 lb (62.143 kg)  SpO2: 98%    Final diagnoses:  Contusion of right ring finger, initial encounter       Elnora Morrison, MD 07/08/15 1700

## 2015-07-08 NOTE — ED Notes (Addendum)
Pt fell on Thursday and caught himself with his right hand. Pt's right ring finger is swollen, red and tender. Pt c/o sharp pain and tingling that radiates from the right ring finger into the posterior aspect of wrist.

## 2015-07-08 NOTE — ED Notes (Signed)
Ice bag placed on pt's right hand/forearm

## 2015-07-08 NOTE — ED Notes (Signed)
Pt fell in yard on Thursday am - hand was painful then however pain and swelling worstened yesterday - Pt's ring finger of rt hand is red, and swollen with some redness going into hand - has purple blister on side of finger .

## 2015-10-06 DIAGNOSIS — F419 Anxiety disorder, unspecified: Secondary | ICD-10-CM | POA: Diagnosis not present

## 2015-10-06 DIAGNOSIS — M545 Low back pain: Secondary | ICD-10-CM | POA: Diagnosis not present

## 2015-10-06 DIAGNOSIS — J45909 Unspecified asthma, uncomplicated: Secondary | ICD-10-CM | POA: Diagnosis not present

## 2015-10-06 DIAGNOSIS — R21 Rash and other nonspecific skin eruption: Secondary | ICD-10-CM | POA: Diagnosis not present

## 2016-01-03 DIAGNOSIS — M722 Plantar fascial fibromatosis: Secondary | ICD-10-CM | POA: Diagnosis not present

## 2016-01-03 DIAGNOSIS — M545 Low back pain: Secondary | ICD-10-CM | POA: Diagnosis not present

## 2016-01-03 DIAGNOSIS — F419 Anxiety disorder, unspecified: Secondary | ICD-10-CM | POA: Diagnosis not present

## 2016-01-03 DIAGNOSIS — D171 Benign lipomatous neoplasm of skin and subcutaneous tissue of trunk: Secondary | ICD-10-CM | POA: Diagnosis not present

## 2016-02-07 ENCOUNTER — Emergency Department (HOSPITAL_COMMUNITY)
Admission: EM | Admit: 2016-02-07 | Discharge: 2016-02-07 | Disposition: A | Discharge status: Home / Self Care | Payer: Medicare Other | Attending: Emergency Medicine | Admitting: Emergency Medicine

## 2016-02-07 ENCOUNTER — Emergency Department (HOSPITAL_COMMUNITY): Payer: Medicare Other

## 2016-02-07 ENCOUNTER — Encounter (HOSPITAL_COMMUNITY): Payer: Self-pay

## 2016-02-07 DIAGNOSIS — S53401A Unspecified sprain of right elbow, initial encounter: Secondary | ICD-10-CM | POA: Diagnosis not present

## 2016-02-07 DIAGNOSIS — Y939 Activity, unspecified: Secondary | ICD-10-CM | POA: Diagnosis not present

## 2016-02-07 DIAGNOSIS — F17210 Nicotine dependence, cigarettes, uncomplicated: Secondary | ICD-10-CM | POA: Diagnosis not present

## 2016-02-07 DIAGNOSIS — Y999 Unspecified external cause status: Secondary | ICD-10-CM | POA: Diagnosis not present

## 2016-02-07 DIAGNOSIS — M7989 Other specified soft tissue disorders: Secondary | ICD-10-CM | POA: Diagnosis not present

## 2016-02-07 DIAGNOSIS — S6391XA Sprain of unspecified part of right wrist and hand, initial encounter: Secondary | ICD-10-CM | POA: Insufficient documentation

## 2016-02-07 DIAGNOSIS — S43401A Unspecified sprain of right shoulder joint, initial encounter: Secondary | ICD-10-CM | POA: Insufficient documentation

## 2016-02-07 DIAGNOSIS — M79641 Pain in right hand: Secondary | ICD-10-CM | POA: Diagnosis present

## 2016-02-07 DIAGNOSIS — W010XXA Fall on same level from slipping, tripping and stumbling without subsequent striking against object, initial encounter: Secondary | ICD-10-CM | POA: Insufficient documentation

## 2016-02-07 DIAGNOSIS — S6991XA Unspecified injury of right wrist, hand and finger(s), initial encounter: Secondary | ICD-10-CM | POA: Diagnosis not present

## 2016-02-07 DIAGNOSIS — M25531 Pain in right wrist: Secondary | ICD-10-CM | POA: Diagnosis not present

## 2016-02-07 DIAGNOSIS — Y92007 Garden or yard of unspecified non-institutional (private) residence as the place of occurrence of the external cause: Secondary | ICD-10-CM | POA: Insufficient documentation

## 2016-02-07 DIAGNOSIS — Z79899 Other long term (current) drug therapy: Secondary | ICD-10-CM | POA: Diagnosis not present

## 2016-02-07 DIAGNOSIS — S4991XA Unspecified injury of right shoulder and upper arm, initial encounter: Secondary | ICD-10-CM | POA: Diagnosis not present

## 2016-02-07 DIAGNOSIS — S59901A Unspecified injury of right elbow, initial encounter: Secondary | ICD-10-CM | POA: Diagnosis not present

## 2016-02-07 MED ORDER — OXYCODONE-ACETAMINOPHEN 5-325 MG PO TABS
1.0000 | ORAL_TABLET | Freq: Once | ORAL | Status: AC
Start: 2016-02-07 — End: 2016-02-07
  Administered 2016-02-07: 1 via ORAL
  Filled 2016-02-07: qty 1

## 2016-02-07 MED ORDER — OXYCODONE-ACETAMINOPHEN 5-325 MG PO TABS: 1.0000 | ORAL_TABLET | ORAL | Status: DC | PRN

## 2016-02-07 NOTE — Discharge Instructions (Signed)

## 2016-02-07 NOTE — ED Notes (Signed)
Pt reports tripped over something in his yard yesterday and c/o pain to r shoulder, elbow, and r hand.  Swelling to r hand noted, abrasion to r elbow, and abrasion to r shoulder.

## 2016-02-07 NOTE — ED Provider Notes (Signed)
CSN: [id number]     Arrival date & time 02/07/16  0805 History   First MD Initiated Contact with Patient 02/07/16 [phone]     Chief Complaint  Patient presents with  . Fall     (Consider location/radiation/quality/duration/timing/severity/associated sxs/prior Treatment) HPI   Keith Nolan is a 45 y.o. male who presents to the Emergency Department complaining of pain to his right shoulder, elbow, wrist and hand.  He reports a mechanical fall yesterday in which he landed on his right arm.  Pain is worse with abduction of the shoulder and movement of the right index and middle fingers.  He reports a constant, throbbing pain.  He has not tried any medications or therapies.  He denies head injury, LOC, neck or back pain, rib pain, shortness of breath or chest pain.     Past Medical History  Diagnosis Date  . Seasonal allergies   . DJD (degenerative joint disease)    History reviewed. No pertinent past surgical history. No family history on file. Social History  Substance Use Topics  . Smoking status: Current Every Day Smoker    Types: Cigarettes  . Smokeless tobacco: Never Used  . Alcohol Use: No    Review of Systems  Constitutional: Negative for fever and chills.  Respiratory: Negative for shortness of breath.   Cardiovascular: Negative for chest pain.  Gastrointestinal: Negative for nausea, vomiting and abdominal pain.  Genitourinary: Negative for dysuria and difficulty urinating.  Musculoskeletal: Positive for joint swelling and arthralgias. Negative for back pain, gait problem and neck pain.  Skin: Negative for color change and wound.  Neurological: Negative for dizziness, syncope, weakness, numbness and headaches.  All other systems reviewed and are negative.     Allergies  Review of patient's allergies indicates no known allergies.  Home Medications   Prior to Admission medications   Medication Sig Start Date End Date Taking? Authorizing Provider  alprazolam  Duanne Moron) 2 MG tablet Take 2 mg by mouth 4 (four) times daily.    Historical Provider, MD  febuxostat (ULORIC) 40 MG tablet Take 40 mg by mouth daily.    Historical Provider, MD  HYDROcodone-acetaminophen (NORCO) 5-325 MG per tablet Take 1-2 tablets by mouth every 4 (four) hours as needed. 07/08/15   Elnora Morrison, MD  ibuprofen (ADVIL,MOTRIN) 600 MG tablet Take 1 tablet (600 mg total) by mouth every 6 (six) hours as needed. 07/08/15   Elnora Morrison, MD   BP 113/62 mmHg  Pulse 87  Temp(Src) 97.9 F (36.6 C) (Oral)  Resp 20  Ht 5\' 6"  (1.676 m)  Wt 58.06 kg  BMI 20.67 kg/m2  SpO2 99% Physical Exam  Constitutional: He is oriented to person, place, and time. He appears well-developed and well-nourished. No distress.  HENT:  Head: Normocephalic and atraumatic.  Eyes: EOM are normal. Pupils are equal, round, and reactive to light.  Neck: Normal range of motion. Neck supple. No thyromegaly present.  Cardiovascular: Normal rate, regular rhythm and intact distal pulses.   No murmur heard. Pulmonary/Chest: Effort normal and breath sounds normal. No respiratory distress. He exhibits no tenderness.  Abdominal: Soft. He exhibits no distension. There is no tenderness.  Musculoskeletal: He exhibits tenderness. He exhibits no edema.  ttp over the [facility] joint right shoulder  Pain with abduction of the right arm, flexion of the elbow and movement and palpation of the right distal hand.  Radial pulse is brisk, distal sensation intact, CR< 2 sec. Abrasions of the elbow with edema and erythema of  the dorsal right hand.  No open wounds of the hand.  Compartments soft  Lymphadenopathy:    He has no cervical adenopathy.  Neurological: He is alert and oriented to person, place, and time. He has normal strength. No sensory deficit. He exhibits normal muscle tone. Coordination normal.  Reflex Scores:      Tricep reflexes are 1+ on the right side and 2+ on the left side.      Bicep reflexes are 1+ on the right side and  2+ on the left side. Skin: Skin is warm and dry.  Nursing note and vitals reviewed.   ED Course  Procedures (including critical care time) Labs Review Labs Reviewed - No data to display  Imaging Review Dg Shoulder Right  02/07/2016  CLINICAL DATA:  Fall yesterday, bruising to top of right shoulder EXAM: RIGHT SHOULDER - 2+ VIEW COMPARISON:  None. FINDINGS: There is no evidence of fracture or dislocation. There is no evidence of arthropathy or other focal bone abnormality. Soft tissues are unremarkable. IMPRESSION: Negative. Electronically Signed   By: Rolm Baptise M.D.   On: 02/07/2016 09:23   Dg Elbow Complete Right  02/07/2016  CLINICAL DATA:  Fall yesterday.  Bruise on back of right elbow EXAM: RIGHT ELBOW - COMPLETE 3+ VIEW COMPARISON:  None. FINDINGS: There is no evidence of fracture, dislocation, or joint effusion. There is no evidence of arthropathy or other focal bone abnormality. Soft tissues are unremarkable. IMPRESSION: Negative. Electronically Signed   By: Rolm Baptise M.D.   On: 02/07/2016 09:23   Dg Wrist Complete Right  02/07/2016  CLINICAL DATA:  Recent trip and fall yesterday with persistent wrist pain, initial encounter EXAM: RIGHT WRIST - COMPLETE 3+ VIEW COMPARISON:  None. FINDINGS: There is no evidence of fracture or dislocation. There is no evidence of arthropathy or other focal bone abnormality. Soft tissues are unremarkable. IMPRESSION: No acute abnormality seen. Electronically Signed   By: Inez Catalina M.D.   On: 02/07/2016 09:28   Dg Hand Complete Right  02/07/2016  CLINICAL DATA:  Trip and fall yesterday with right hand pain, initial encounter EXAM: RIGHT HAND - COMPLETE 3+ VIEW COMPARISON:  None. FINDINGS: Mild soft tissue swelling is noted. No acute fracture dislocation is seen. No other focal abnormality is noted. IMPRESSION: Soft tissue swelling without acute bony abnormality. Electronically Signed   By: Inez Catalina M.D.   On: 02/07/2016 09:27    I have  personally reviewed and evaluated these images and lab results as part of my medical decision-making.   EKG Interpretation None      MDM   Final diagnoses:  Shoulder sprain, right, initial encounter  Sprain, hand, right, initial encounter  Elbow sprain, right, initial encounter    Pt well appearing, vitals stable.  XR's neg for fx.  NV intact.    Wrist splint applied.  Remains NV intact.  Sling given.  Wear instructions given.  Pt appears stable for d/c and agrees to ortho f/u.     Kem Parkinson, PA-C 02/09/16 2233  Milton Ferguson, MD 02/12/16 [phone]

## 2016-02-08 DIAGNOSIS — L723 Sebaceous cyst: Secondary | ICD-10-CM | POA: Diagnosis not present

## 2016-03-25 DIAGNOSIS — Z79891 Long term (current) use of opiate analgesic: Secondary | ICD-10-CM | POA: Diagnosis not present

## 2016-04-11 DIAGNOSIS — M722 Plantar fascial fibromatosis: Secondary | ICD-10-CM | POA: Diagnosis not present

## 2016-04-11 DIAGNOSIS — M545 Low back pain: Secondary | ICD-10-CM | POA: Diagnosis not present

## 2016-04-11 DIAGNOSIS — J45909 Unspecified asthma, uncomplicated: Secondary | ICD-10-CM | POA: Diagnosis not present

## 2016-04-11 DIAGNOSIS — D171 Benign lipomatous neoplasm of skin and subcutaneous tissue of trunk: Secondary | ICD-10-CM | POA: Diagnosis not present

## 2016-04-30 DIAGNOSIS — L723 Sebaceous cyst: Secondary | ICD-10-CM | POA: Diagnosis not present

## 2016-10-01 ENCOUNTER — Encounter (HOSPITAL_COMMUNITY): Payer: Self-pay | Admitting: Emergency Medicine

## 2016-10-01 ENCOUNTER — Other Ambulatory Visit (HOSPITAL_COMMUNITY): Payer: Self-pay | Admitting: Pulmonary Disease

## 2016-10-01 ENCOUNTER — Ambulatory Visit (HOSPITAL_COMMUNITY)
Admission: RE | Admit: 2016-10-01 | Discharge: 2016-10-01 | Disposition: A | Discharge status: Home / Self Care | Payer: Medicare Other | Source: Ambulatory Visit | Attending: Pulmonary Disease | Admitting: Pulmonary Disease

## 2016-10-01 ENCOUNTER — Emergency Department (HOSPITAL_COMMUNITY)
Admission: EM | Admit: 2016-10-01 | Discharge: 2016-10-01 | Disposition: A | Discharge status: Home / Self Care | Payer: Medicare Other | Attending: Emergency Medicine | Admitting: Emergency Medicine

## 2016-10-01 DIAGNOSIS — M79671 Pain in right foot: Secondary | ICD-10-CM

## 2016-10-01 DIAGNOSIS — M7989 Other specified soft tissue disorders: Secondary | ICD-10-CM | POA: Diagnosis not present

## 2016-10-01 DIAGNOSIS — F17210 Nicotine dependence, cigarettes, uncomplicated: Secondary | ICD-10-CM | POA: Diagnosis not present

## 2016-10-01 DIAGNOSIS — J45909 Unspecified asthma, uncomplicated: Secondary | ICD-10-CM | POA: Diagnosis not present

## 2016-10-01 DIAGNOSIS — F419 Anxiety disorder, unspecified: Secondary | ICD-10-CM | POA: Diagnosis not present

## 2016-10-01 DIAGNOSIS — M25571 Pain in right ankle and joints of right foot: Secondary | ICD-10-CM | POA: Insufficient documentation

## 2016-10-01 DIAGNOSIS — M545 Low back pain: Secondary | ICD-10-CM | POA: Diagnosis not present

## 2016-10-01 DIAGNOSIS — Z79899 Other long term (current) drug therapy: Secondary | ICD-10-CM | POA: Insufficient documentation

## 2016-10-01 MED ORDER — DICLOFENAC SODIUM 75 MG PO TBEC: 75.0000 mg | DELAYED_RELEASE_TABLET | Freq: Two times a day (BID) | ORAL | 0 refills | Status: DC

## 2016-10-01 MED ORDER — HYDROCODONE-ACETAMINOPHEN 5-325 MG PO TABS
1.0000 | ORAL_TABLET | Freq: Once | ORAL | Status: AC
  Administered 2016-10-01: 1 via ORAL
  Filled 2016-10-01: qty 1

## 2016-10-01 NOTE — ED Provider Notes (Signed)
[facility] DEPT Provider Note   CSN: [id number] Arrival date & time: 10/01/16  1214     History   Chief Complaint Chief Complaint  Patient presents with  . Foot Pain    HPI Keith Nolan is a 45 y.o. male.  HPI   Keith Nolan is a 45 y.o. male who presents to the Emergency Department complaining of pain to his right foot and ankle.  He reports having pain for several weeks, but pain became worse 4 days ago after stepping in a hole in the yard.  He was seen by his PCP and had X rays done and decided to be evaluated in the ER for pain control and requesting a splint.  He reports having pain in his foot that is worse with weight bearing and states he has to walk with his right foot "pointing out" to avoid having severe pain.  Pain radiates to his calf with movement. He denies numbness, swelling, redness hip pain or open wounds.  Past Medical History:  Diagnosis Date  . DJD (degenerative joint disease)   . Seasonal allergies     Patient Active Problem List   Diagnosis Date Noted  . 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 JOINT EFFUSION, LEFT KNEE 10/25/2010    History reviewed. No pertinent surgical history.     Home Medications    Prior to Admission medications   Medication Sig Start Date End Date Taking? Authorizing Provider  alprazolam Duanne Moron) 2 MG tablet Take 2 mg by mouth every 6 (six) hours as needed for anxiety.    Yes Historical Provider, MD  febuxostat (ULORIC) 40 MG tablet Take 40 mg by mouth at bedtime.    Yes Historical Provider, MD  HYDROcodone-acetaminophen (NORCO) 7.5-325 MG tablet Take 1 tablet by mouth 4 (four) times daily. 09/11/16  Yes Historical Provider, MD  diclofenac (VOLTAREN) 75 MG EC tablet Take 1 tablet (75 mg total) by mouth 2 (two) times daily. Take with food 10/01/16   Landyn Lorincz, PA-C  ibuprofen (ADVIL,MOTRIN) 600 MG tablet Take 1 tablet (600 mg total) by mouth every 6  (six) hours as needed. Patient not taking: Reported on 10/01/2016 07/08/15   Elnora Morrison, MD    Family History History reviewed. No pertinent family history.  Social History Social History  Substance Use Topics  . Smoking status: Current Every Day Smoker    Packs/day: 0.50    Types: Cigarettes  . Smokeless tobacco: Never Used  . Alcohol use No     Allergies   Patient has no known allergies.   Review of Systems Review of Systems  Constitutional: Negative for chills and fever.  Musculoskeletal: Positive for arthralgias (right foot and ankle pain). Negative for back pain and joint swelling.  Skin: Negative for color change and wound.  Neurological: Negative for weakness and numbness.  All other systems reviewed and are negative.    Physical Exam Updated Vital Signs BP 132/82 (BP Location: Right Arm)   Pulse 91   Temp 99.5 F (37.5 C) (Oral)   Resp 17   Ht 5\' 6"  (1.676 m)   Wt 60.2 kg   SpO2 100%   BMI 21.43 kg/m   Physical Exam  Constitutional: He appears well-developed and well-nourished. No distress.  HENT:  Head: Atraumatic.  Cardiovascular: Normal rate, regular rhythm and intact distal pulses.   Pulmonary/Chest: Effort normal. No respiratory distress.  Musculoskeletal: He exhibits tenderness. He exhibits no edema.  Diffuse tenderness of the medial  right hindfoot and medial and lateral ankle.  No rash, edema and erythema.  DP pulse brisk.  Sensation intact  Neurological: He is alert.  Skin: Skin is warm. No rash noted. No erythema.  Nursing note and vitals reviewed.    ED Treatments / Results  Labs (all labs ordered are listed, but only abnormal results are displayed) Labs Reviewed - No data to display  EKG  EKG Interpretation None       Radiology Dg Foot Complete Right  Result Date: 10/01/2016 CLINICAL DATA:  Soft tissue swelling EXAM: RIGHT FOOT COMPLETE - 3+ VIEW COMPARISON:  November 01, 2005 FINDINGS: Frontal, oblique, and lateral views  were obtained. There is no fracture or dislocation. Joint spaces appear normal. No erosive change. IMPRESSION: No fracture or dislocation.  No evident arthropathy. Electronically Signed   By: Lowella Grip III M.D.   On: 10/01/2016 13:40    Procedures Procedures (including critical care time)  Medications Ordered in ED Medications  HYDROcodone-acetaminophen (NORCO/VICODIN) 5-325 MG per tablet 1 tablet (1 tablet Oral Given 10/01/16 1456)     Initial Impression / Assessment and Plan / ED Course  I have reviewed the triage vital signs and the nursing notes.  Pertinent labs & imaging results that were available during my care of the patient were reviewed by me and considered in my medical decision making (see chart for details).  Clinical Course     Pt reviewed on the [place] narcotic database, received #120 hydrocodone/APAP 7.5/325 mg on 09/11/16 for 30 day supply.    Pt is waiting for referral by his PMD for podiatry, he understands that further narcotics are not indicated at this time given the chronic nature of his pain and no clinical evidence for fx, cellulitis, or NV process. Pt verbalized understanding and agrees to tx plan  ASO splint applied for support.    Final Clinical Impressions(s) / ED Diagnoses   Final diagnoses:  Foot pain, right    New Prescriptions Discharge Medication List as of 10/01/2016  2:51 PM    START taking these medications   Details  diclofenac (VOLTAREN) 75 MG EC tablet Take 1 tablet (75 mg total) by mouth 2 (two) times daily. Take with food, Starting Tue 10/01/2016, Print         Kareena Arrambide [place], PA-C 10/01/16 [place], MD 10/04/16 [phone]

## 2016-10-01 NOTE — ED Triage Notes (Signed)
Pt seen by PCP and sent for xrays of his R foot and ankle. Pt states he is having difficulty walking and has pain and edema around the achilles area.

## 2016-10-01 NOTE — Discharge Instructions (Signed)
Follow-up with your primary doctor or call Dr. Arvil Persons office to arrange a follow-up appt

## 2016-10-27 ENCOUNTER — Encounter (HOSPITAL_COMMUNITY): Payer: Self-pay | Admitting: Emergency Medicine

## 2016-10-27 ENCOUNTER — Emergency Department (HOSPITAL_COMMUNITY)
Admission: EM | Admit: 2016-10-27 | Discharge: 2016-10-27 | Disposition: A | Discharge status: Home / Self Care | Payer: Medicare Other | Attending: Emergency Medicine | Admitting: Emergency Medicine

## 2016-10-27 DIAGNOSIS — F17210 Nicotine dependence, cigarettes, uncomplicated: Secondary | ICD-10-CM | POA: Insufficient documentation

## 2016-10-27 DIAGNOSIS — Z79899 Other long term (current) drug therapy: Secondary | ICD-10-CM | POA: Insufficient documentation

## 2016-10-27 DIAGNOSIS — K047 Periapical abscess without sinus: Secondary | ICD-10-CM | POA: Diagnosis not present

## 2016-10-27 DIAGNOSIS — K0889 Other specified disorders of teeth and supporting structures: Secondary | ICD-10-CM | POA: Diagnosis present

## 2016-10-27 MED ORDER — TRAMADOL HCL 50 MG PO TABS: 50.0000 mg | ORAL_TABLET | Freq: Four times a day (QID) | ORAL | 0 refills | Status: DC | PRN

## 2016-10-27 MED ORDER — CLINDAMYCIN HCL 300 MG PO CAPS: 300.0000 mg | ORAL_CAPSULE | Freq: Four times a day (QID) | ORAL | 0 refills | Status: DC

## 2016-10-27 NOTE — ED Triage Notes (Signed)
Patient c/o dental abscess to right upper mouth. Per patient woke with swelling. Patient reports taking ibuprofen for pain which was effective. Patient states here today in ER get antibiotic so that he can go to dentist.

## 2016-10-27 NOTE — Discharge Instructions (Signed)
Complete your entire course of antibiotics as prescribed.  You  may use the tramadol for pain relief but do not drive within 4 hours of taking as this will make you drowsy.  Avoid applying heat or ice to this abscess area which can worsen your symptoms.  You may use warm salt water swish and spit treatment or half peroxide and water swish and spit after meals to keep this area clean as discussed.  I have listed several local [facility] Oral surgeons above.

## 2016-10-27 NOTE — ED Provider Notes (Signed)
Keith Nolan Provider Note   CSN: [id number] Arrival date & time: 10/27/16  0850     History   Chief Complaint Chief Complaint  Patient presents with  . Dental Pain    HPI Keith Nolan is a 46 y.o. male presenting with a one day history of dental pain and gingival swelling.  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 There has been no fevers, chills, nausea or vomiting, also no complaint of difficulty swallowing, although chewing makes pain worse.  The patient has taken ibuprofen with transient improvement.  He takes hydrocodone chronically for back pain but this medicine has not improved his dental pain.  The history is provided by the patient.    Past Medical History:  Diagnosis Date  . DJD (degenerative joint disease)   . Seasonal allergies     Patient Active Problem List   Diagnosis Date Noted  . 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 JOINT EFFUSION, LEFT KNEE 10/25/2010    History reviewed. No pertinent surgical history.     Home Medications    Prior to Admission medications   Medication Sig Start Date End Date Taking? Authorizing Provider  alprazolam Duanne Moron) 2 MG tablet Take 2 mg by mouth every 6 (six) hours as needed for anxiety.     Historical Provider, MD  clindamycin (CLEOCIN) 300 MG capsule Take 1 capsule (300 mg total) by mouth 4 (four) times daily. 10/27/16   Evalee Jefferson, PA-C  diclofenac (VOLTAREN) 75 MG EC tablet Take 1 tablet (75 mg total) by mouth 2 (two) times daily. Take with food 10/01/16   Tammy Triplett, PA-C  febuxostat (ULORIC) 40 MG tablet Take 40 mg by mouth at bedtime.     Historical Provider, MD  HYDROcodone-acetaminophen (NORCO) 7.5-325 MG tablet Take 1 tablet by mouth 4 (four) times daily. 09/11/16   Historical Provider, MD  ibuprofen (ADVIL,MOTRIN) 600 MG tablet Take 1 tablet (600 mg total) by  mouth every 6 (six) hours as needed. Patient not taking: Reported on 10/01/2016 07/08/15   Elnora Morrison, MD  traMADol (ULTRAM) 50 MG tablet Take 1 tablet (50 mg total) by mouth every 6 (six) hours as needed. 10/27/16   Evalee Jefferson, PA-C    Family History No family history on file.  Social History Social History  Substance Use Topics  . Smoking status: Current Every Day Smoker    Packs/day: 0.50    Years: 30.00    Types: Cigarettes  . Smokeless tobacco: Never Used     Comment: vaping  . Alcohol use No     Allergies   Patient has no known allergies.   Review of Systems Review of Systems  Constitutional: Negative for fever.  HENT: Positive for dental problem. Negative for facial swelling and sore throat.   Respiratory: Negative for shortness of breath.   Musculoskeletal: Negative for neck pain and neck stiffness.     Physical Exam Updated Vital Signs BP 137/80 (BP Location: Right Arm)   Pulse 77   Temp 97.7 F (36.5 C) (Oral)   Resp 17   Ht 5\' 6"  (1.676 m)   Wt 59.4 kg   SpO2 98%   BMI 21.14 kg/m   Physical Exam  Constitutional: He is oriented to person, place, and time. He appears well-developed and well-nourished. No distress.  HENT:  Head: Normocephalic and atraumatic.  Right Ear: Tympanic membrane and external ear normal.  Left Ear: Tympanic membrane and external ear normal.  Mouth/Throat: Oropharynx is clear and moist and mucous membranes are normal. No oral lesions. No trismus in the jaw. Dental abscesses present.  Poor dentition with severe gingival recession.  Soft edema right cheek with no pointing or palpable abscess pocket.    Eyes: Conjunctivae are normal.  Neck: Normal range of motion. Neck supple.  Cardiovascular: Normal rate and normal heart sounds.   Pulmonary/Chest: Effort normal.  Musculoskeletal: Normal range of motion.  Lymphadenopathy:    He has no cervical adenopathy.  Neurological: He is alert and oriented to person, place, and time.    Skin: Skin is warm and dry. No erythema.  Psychiatric: He has a normal mood and affect.     ED Treatments / Results  Labs (all labs ordered are listed, but only abnormal results are displayed) Labs Reviewed - No data to display  EKG  EKG Interpretation None       Radiology No results found.  Procedures Procedures (including critical care time)  Medications Ordered in ED Medications - No data to display   Initial Impression / Assessment and Plan / ED Course  I have reviewed the triage vital signs and the nursing notes.  Pertinent labs & imaging results that were available during my care of the patient were reviewed by me and considered in my medical decision making (see chart for details).  Clinical Course     Advised recheck by dentistry/oral surgery (referrals given).  Return here for any worsened swelling, pain, redness, fevers, etc.  Clindamycin, tramadol prescribed.  Final Clinical Impressions(s) / ED Diagnoses   Final diagnoses:  Dental abscess    New Prescriptions New Prescriptions   CLINDAMYCIN (CLEOCIN) 300 MG CAPSULE    Take 1 capsule (300 mg total) by mouth 4 (four) times daily.   TRAMADOL (ULTRAM) 50 MG TABLET    Take 1 tablet (50 mg total) by mouth every 6 (six) hours as needed.     Evalee Jefferson, PA-C 10/27/16 [place], PA-C 10/27/16 [place], [place] 10/27/16 [phone]

## 2016-11-11 DIAGNOSIS — M7662 Achilles tendinitis, left leg: Secondary | ICD-10-CM | POA: Diagnosis not present

## 2016-11-11 DIAGNOSIS — M7731 Calcaneal spur, right foot: Secondary | ICD-10-CM | POA: Diagnosis not present

## 2016-11-11 DIAGNOSIS — M7661 Achilles tendinitis, right leg: Secondary | ICD-10-CM | POA: Diagnosis not present

## 2016-11-11 DIAGNOSIS — M7732 Calcaneal spur, left foot: Secondary | ICD-10-CM | POA: Diagnosis not present

## 2016-11-11 DIAGNOSIS — M71572 Other bursitis, not elsewhere classified, left ankle and foot: Secondary | ICD-10-CM | POA: Diagnosis not present

## 2016-11-11 DIAGNOSIS — M71571 Other bursitis, not elsewhere classified, right ankle and foot: Secondary | ICD-10-CM | POA: Diagnosis not present

## 2016-12-11 DIAGNOSIS — M7661 Achilles tendinitis, right leg: Secondary | ICD-10-CM | POA: Diagnosis not present

## 2016-12-11 DIAGNOSIS — M722 Plantar fascial fibromatosis: Secondary | ICD-10-CM | POA: Diagnosis not present

## 2016-12-30 DIAGNOSIS — J45909 Unspecified asthma, uncomplicated: Secondary | ICD-10-CM | POA: Diagnosis not present

## 2016-12-30 DIAGNOSIS — F419 Anxiety disorder, unspecified: Secondary | ICD-10-CM | POA: Diagnosis not present

## 2016-12-30 DIAGNOSIS — M7730 Calcaneal spur, unspecified foot: Secondary | ICD-10-CM | POA: Diagnosis not present

## 2016-12-30 DIAGNOSIS — M545 Low back pain: Secondary | ICD-10-CM | POA: Diagnosis not present

## 2016-12-31 ENCOUNTER — Other Ambulatory Visit (HOSPITAL_COMMUNITY): Payer: Self-pay | Admitting: Pulmonary Disease

## 2016-12-31 DIAGNOSIS — M545 Low back pain: Secondary | ICD-10-CM

## 2017-01-10 ENCOUNTER — Ambulatory Visit (HOSPITAL_COMMUNITY): Payer: Medicare Other

## 2017-04-07 DIAGNOSIS — W57XXXA Bitten or stung by nonvenomous insect and other nonvenomous arthropods, initial encounter: Secondary | ICD-10-CM | POA: Diagnosis not present

## 2017-04-07 DIAGNOSIS — J45909 Unspecified asthma, uncomplicated: Secondary | ICD-10-CM | POA: Diagnosis not present

## 2017-04-07 DIAGNOSIS — M545 Low back pain: Secondary | ICD-10-CM | POA: Diagnosis not present

## 2017-04-07 DIAGNOSIS — F419 Anxiety disorder, unspecified: Secondary | ICD-10-CM | POA: Diagnosis not present

## 2017-07-08 DIAGNOSIS — K029 Dental caries, unspecified: Secondary | ICD-10-CM | POA: Diagnosis not present

## 2017-07-08 DIAGNOSIS — F419 Anxiety disorder, unspecified: Secondary | ICD-10-CM | POA: Diagnosis not present

## 2017-07-08 DIAGNOSIS — M545 Low back pain: Secondary | ICD-10-CM | POA: Diagnosis not present

## 2017-07-08 DIAGNOSIS — J45909 Unspecified asthma, uncomplicated: Secondary | ICD-10-CM | POA: Diagnosis not present

## 2017-10-07 ENCOUNTER — Ambulatory Visit (HOSPITAL_COMMUNITY)
Admission: RE | Admit: 2017-10-07 | Discharge: 2017-10-07 | Disposition: A | Discharge status: Home / Self Care | Payer: Medicare Other | Source: Ambulatory Visit | Attending: Pulmonary Disease | Admitting: Pulmonary Disease

## 2017-10-07 ENCOUNTER — Other Ambulatory Visit (HOSPITAL_COMMUNITY): Payer: Self-pay | Admitting: Pulmonary Disease

## 2017-10-07 DIAGNOSIS — J449 Chronic obstructive pulmonary disease, unspecified: Secondary | ICD-10-CM | POA: Diagnosis present

## 2017-10-07 DIAGNOSIS — M545 Low back pain: Secondary | ICD-10-CM | POA: Diagnosis not present

## 2017-10-07 DIAGNOSIS — F17200 Nicotine dependence, unspecified, uncomplicated: Secondary | ICD-10-CM | POA: Diagnosis not present

## 2017-10-07 DIAGNOSIS — F419 Anxiety disorder, unspecified: Secondary | ICD-10-CM | POA: Diagnosis not present

## 2018-01-06 DIAGNOSIS — M545 Low back pain: Secondary | ICD-10-CM | POA: Diagnosis not present

## 2018-01-06 DIAGNOSIS — J301 Allergic rhinitis due to pollen: Secondary | ICD-10-CM | POA: Diagnosis not present

## 2018-01-06 DIAGNOSIS — J45909 Unspecified asthma, uncomplicated: Secondary | ICD-10-CM | POA: Diagnosis not present

## 2018-04-08 DIAGNOSIS — J301 Allergic rhinitis due to pollen: Secondary | ICD-10-CM | POA: Diagnosis not present

## 2018-04-08 DIAGNOSIS — Z79891 Long term (current) use of opiate analgesic: Secondary | ICD-10-CM | POA: Diagnosis not present

## 2018-04-08 DIAGNOSIS — M545 Low back pain: Secondary | ICD-10-CM | POA: Diagnosis not present

## 2018-04-08 DIAGNOSIS — J45909 Unspecified asthma, uncomplicated: Secondary | ICD-10-CM | POA: Diagnosis not present

## 2018-07-09 DIAGNOSIS — J301 Allergic rhinitis due to pollen: Secondary | ICD-10-CM | POA: Diagnosis not present

## 2018-07-09 DIAGNOSIS — M545 Low back pain: Secondary | ICD-10-CM | POA: Diagnosis not present

## 2018-07-09 DIAGNOSIS — J45909 Unspecified asthma, uncomplicated: Secondary | ICD-10-CM | POA: Diagnosis not present

## 2018-07-09 LAB — LIPID PANEL: LDl/HDL Ratio: 3.1

## 2018-07-10 DIAGNOSIS — J45909 Unspecified asthma, uncomplicated: Secondary | ICD-10-CM | POA: Diagnosis not present

## 2018-07-10 DIAGNOSIS — M545 Low back pain: Secondary | ICD-10-CM | POA: Diagnosis not present

## 2018-07-10 LAB — HEPATIC FUNCTION PANEL
ALT: 9 — AB (ref 10–40)
AST: 14 (ref 14–40)
Alkaline Phosphatase: 71 (ref 25–125)
Bilirubin, Total: 0.3

## 2018-07-10 LAB — LIPID PANEL
Cholesterol: 108 (ref 0–200)
HDL: 35 (ref 35–70)
LDL Cholesterol: 59
Triglycerides: 63 (ref 40–160)

## 2018-07-10 LAB — BASIC METABOLIC PANEL
BUN: 11 (ref 4–21)
Creatinine: 0.9 (ref 0.6–1.3)
Glucose: 93
Sodium: 139 (ref 137–147)

## 2018-07-25 IMAGING — DX DG FOOT COMPLETE 3+V*R*
3 series · 3 of 3 positions shown · non-contrast
Comparison: November 01, 2005

CLINICAL DATA: Soft tissue swelling

EXAM:
RIGHT FOOT COMPLETE - 3+ VIEW

[foot ap]
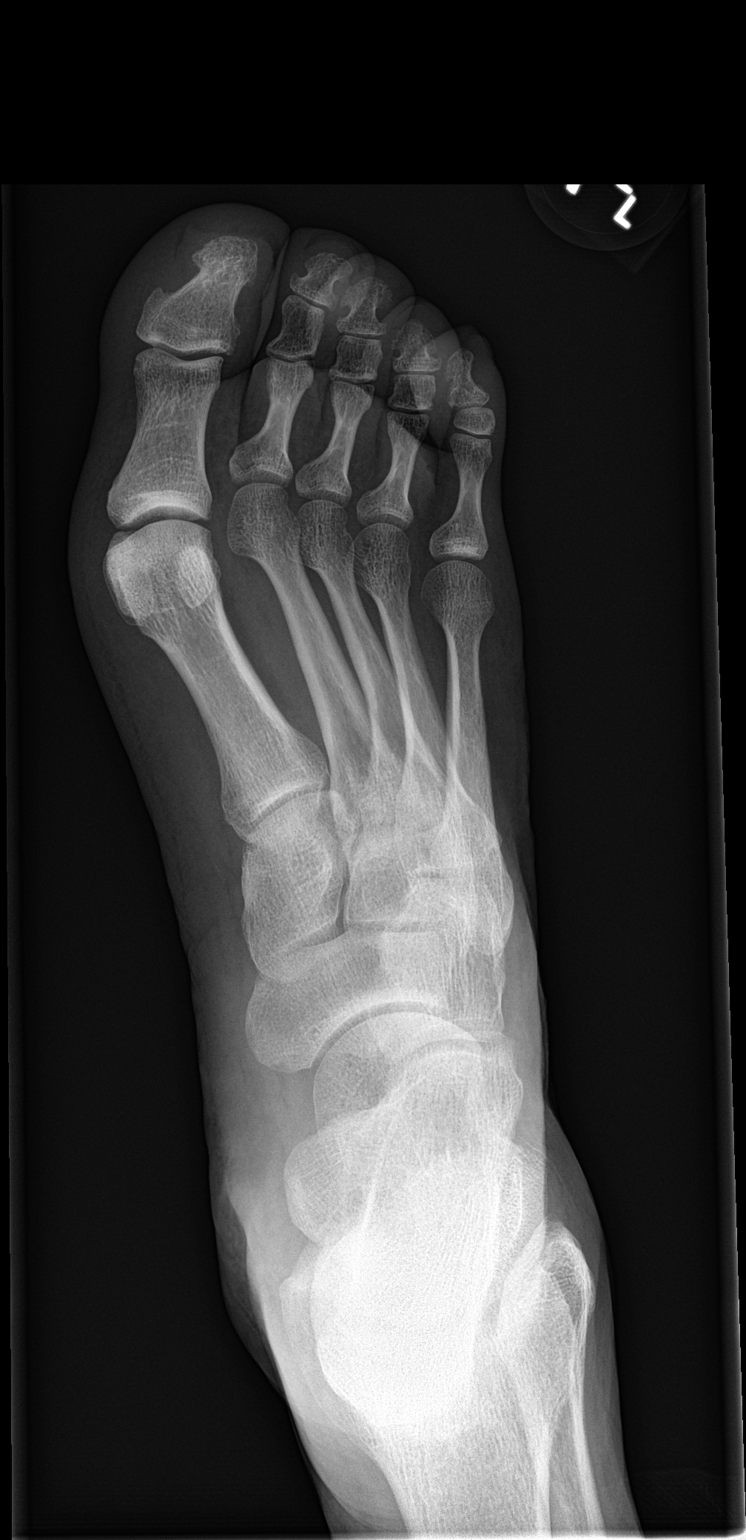

[foot obl]
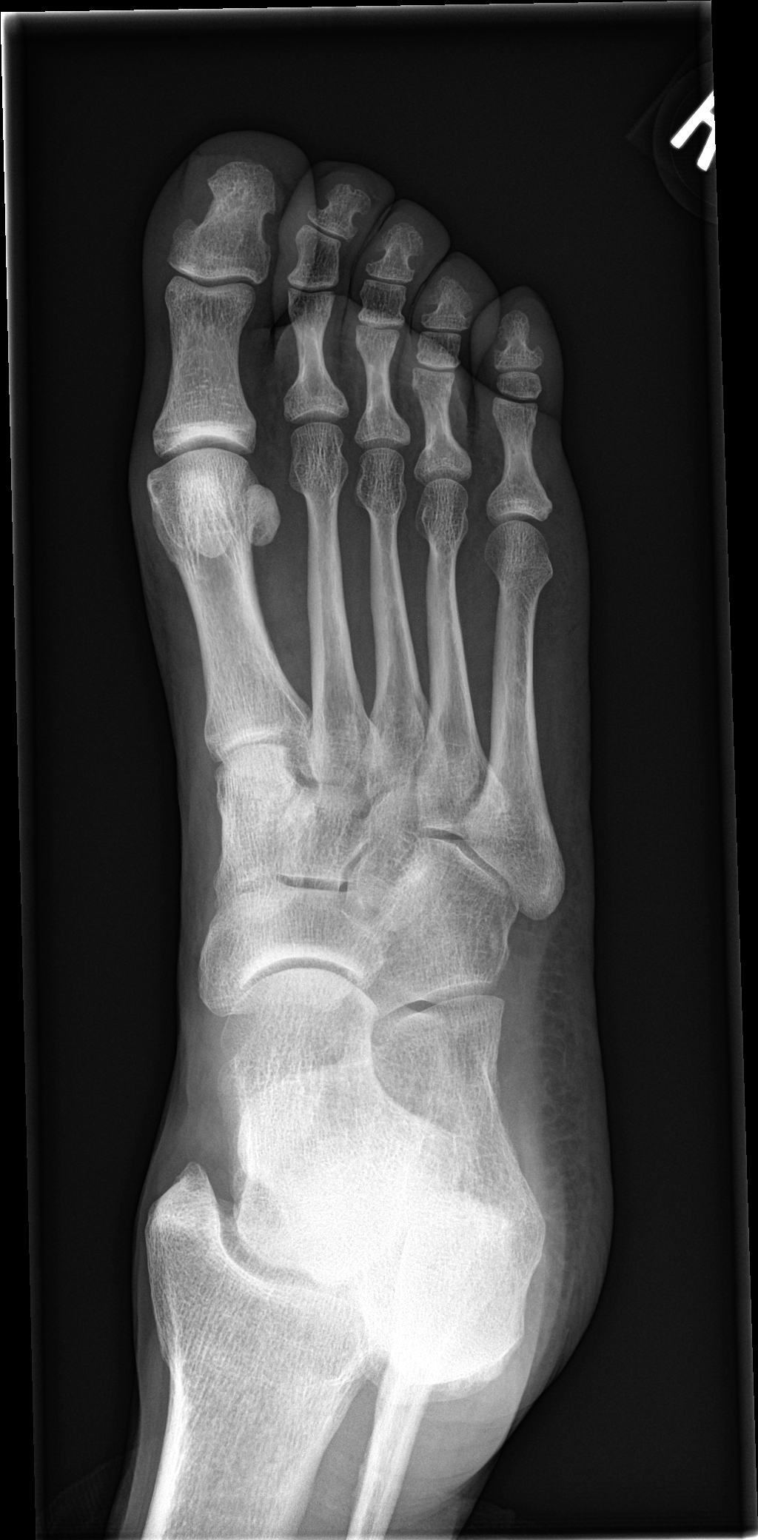

[foot lat]
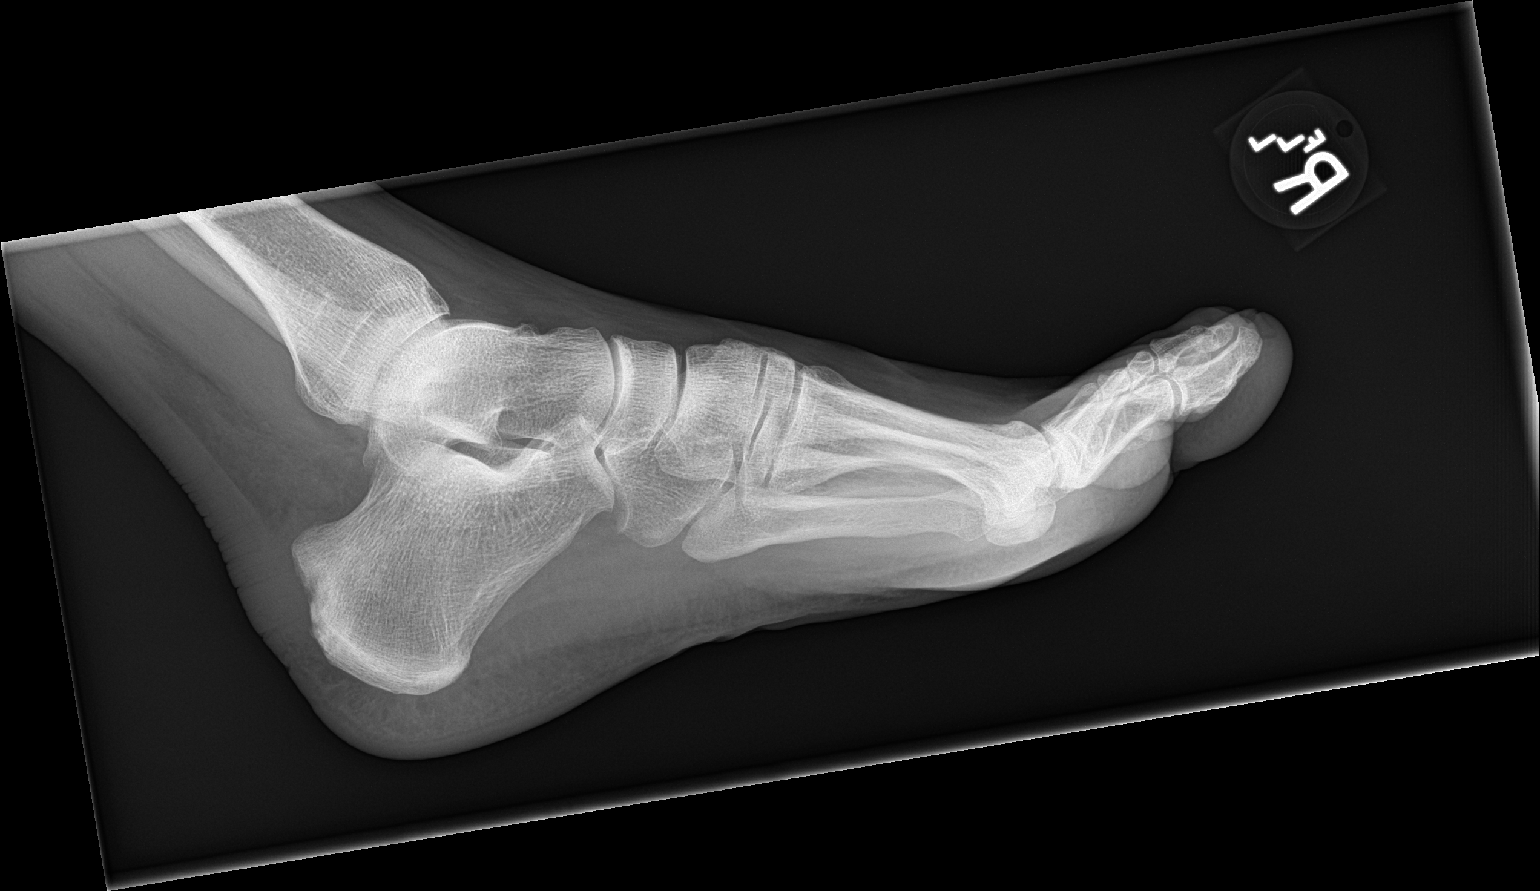

[3 of 3 positions shown; findings below may reference images not displayed]

FINDINGS: Frontal, oblique, and lateral views were obtained. There is no
fracture or dislocation. Joint spaces appear normal. No erosive
change.
IMPRESSION: No fracture or dislocation.  No evident arthropathy.

## 2018-08-24 DIAGNOSIS — M545 Low back pain: Secondary | ICD-10-CM | POA: Diagnosis not present

## 2018-09-14 DIAGNOSIS — Z23 Encounter for immunization: Secondary | ICD-10-CM | POA: Diagnosis not present

## 2018-11-06 ENCOUNTER — Other Ambulatory Visit: Payer: Self-pay

## 2018-11-06 ENCOUNTER — Encounter (HOSPITAL_COMMUNITY): Payer: Self-pay

## 2018-11-06 ENCOUNTER — Emergency Department (HOSPITAL_COMMUNITY)
Admission: EM | Admit: 2018-11-06 | Discharge: 2018-11-06 | Disposition: A | Discharge status: Home / Self Care | Payer: Medicare Other | Attending: Emergency Medicine | Admitting: Emergency Medicine

## 2018-11-06 DIAGNOSIS — Z79899 Other long term (current) drug therapy: Secondary | ICD-10-CM | POA: Insufficient documentation

## 2018-11-06 DIAGNOSIS — R5381 Other malaise: Secondary | ICD-10-CM | POA: Diagnosis not present

## 2018-11-06 DIAGNOSIS — F17210 Nicotine dependence, cigarettes, uncomplicated: Secondary | ICD-10-CM | POA: Diagnosis not present

## 2018-11-06 DIAGNOSIS — R9431 Abnormal electrocardiogram [ECG] [EKG]: Secondary | ICD-10-CM | POA: Diagnosis not present

## 2018-11-06 DIAGNOSIS — R5383 Other fatigue: Secondary | ICD-10-CM | POA: Diagnosis present

## 2018-11-06 LAB — CBG MONITORING, ED
GLUCOSE-CAPILLARY: 95 mg/dL (ref 70–99)
GLUCOSE-CAPILLARY: 86 mg/dL (ref 70–99)

## 2018-11-06 LAB — TROPONIN I

## 2018-11-06 LAB — CBC WITH DIFFERENTIAL/PLATELET
ABS IMMATURE GRANULOCYTES: 0.05 10*3/uL (ref 0.00–0.07)
BASOS ABS: 0.1 10*3/uL (ref 0.0–0.1)
Basophils Relative: 1 %
Eosinophils Absolute: 0.5 10*3/uL (ref 0.0–0.5)
Eosinophils Relative: 4 %
HCT: 45.6 % (ref 39.0–52.0)
HEMOGLOBIN: 14.7 g/dL (ref 13.0–17.0)
Immature Granulocytes: 0 %
Lymphocytes Relative: 23 %
Lymphs Abs: 3.3 10*3/uL (ref 0.7–4.0)
MCH: 30.2 pg (ref 26.0–34.0)
MCHC: 32.2 g/dL (ref 30.0–36.0)
MCV: 93.8 fL (ref 80.0–100.0)
MONO ABS: 0.7 10*3/uL (ref 0.1–1.0)
MONOS PCT: 5 %
NEUTROS PCT: 67 %
Neutro Abs: 9.5 10*3/uL — ABNORMAL HIGH (ref 1.7–7.7)
Platelets: 295 10*3/uL (ref 150–400)
RBC: 4.86 MIL/uL (ref 4.22–5.81)
RDW: 12.7 % (ref 11.5–15.5)
WBC: 14 10*3/uL — ABNORMAL HIGH (ref 4.0–10.5)
nRBC: 0 % (ref 0.0–0.2)

## 2018-11-06 LAB — URINALYSIS, ROUTINE W REFLEX MICROSCOPIC
BACTERIA UA: NONE SEEN
BILIRUBIN URINE: NEGATIVE
GLUCOSE, UA: NEGATIVE mg/dL
Ketones, ur: NEGATIVE mg/dL
Leukocytes, UA: NEGATIVE
Nitrite: NEGATIVE
PROTEIN: NEGATIVE mg/dL
Specific Gravity, Urine: 1.008 (ref 1.005–1.030)
pH: 6 (ref 5.0–8.0)

## 2018-11-06 LAB — BASIC METABOLIC PANEL
Anion gap: 8 (ref 5–15)
BUN: 15 mg/dL (ref 6–20)
CHLORIDE: 104 mmol/L (ref 98–111)
CO2: 26 mmol/L (ref 22–32)
Calcium: 9.1 mg/dL (ref 8.9–10.3)
Creatinine, Ser: 0.9 mg/dL (ref 0.61–1.24)
GFR calc non Af Amer: >60 mL/min (ref >60)
Glucose, Bld: 93 mg/dL (ref 70–99)
POTASSIUM: 3.8 mmol/L (ref 3.5–5.1)
SODIUM: 138 mmol/L (ref 135–145)

## 2018-11-06 NOTE — ED Provider Notes (Signed)
[facility] EMERGENCY DEPARTMENT Provider Note   CSN: [id number] Arrival date & time: 11/06/18  [place]     History   Chief Complaint Chief Complaint  Patient presents with  . Hyperglycemia    HPI Keith Nolan is a 48 y.o. male.  Patient states he has not been feeling well lately.  He had his glucose checked by a friend and it was "186".  No polydipsia, polyphagia, polyuria.  Glucose in the ER was normal.  Patient has a past medical history of anxiety and takes Xanax 2 mg 3-4 times a day.  No fever, sweats, chills, cough, dysuria, substernal chest pain.  Primary care Dr. Luan Pulling     Past Medical History:  Diagnosis Date  . DJD (degenerative joint disease)   . Seasonal allergies     Patient Active Problem List   Diagnosis Date Noted  . 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 JOINT EFFUSION, LEFT KNEE 10/25/2010    History reviewed. No pertinent surgical history.      Home Medications    Prior to Admission medications   Medication Sig Start Date End Date Taking? Authorizing Provider  alprazolam Duanne Moron) 2 MG tablet Take 2 mg by mouth every 6 (six) hours as needed for anxiety.     [provider]  clindamycin (CLEOCIN) 300 MG capsule Take 1 capsule (300 mg total) by mouth 4 (four) times daily. 10/27/16   Evalee Jefferson, PA-C  diclofenac (VOLTAREN) 75 MG EC tablet Take 1 tablet (75 mg total) by mouth 2 (two) times daily. Take with food 10/01/16   Triplett, Tammy, PA-C  febuxostat (ULORIC) 40 MG tablet Take 40 mg by mouth at bedtime.     [provider]  HYDROcodone-acetaminophen (NORCO) 7.5-325 MG tablet Take 1 tablet by mouth 4 (four) times daily. 09/11/16   [provider]  ibuprofen (ADVIL,MOTRIN) 600 MG tablet Take 1 tablet (600 mg total) by mouth every 6 (six) hours as needed. Patient not taking: Reported on 10/01/2016 07/08/15   Elnora Morrison, MD  traMADol (ULTRAM) 50 MG tablet Take 1  tablet (50 mg total) by mouth every 6 (six) hours as needed. 10/27/16   Evalee Jefferson, PA-C    Family History No family history on file.  Social History Social History   Tobacco Use  . Smoking status: Current Every Day Smoker    Packs/day: 0.50    Years: 30.00    Pack years: 15.00    Types: Cigarettes  . Smokeless tobacco: Never Used  . Tobacco comment: vaping  Substance Use Topics  . Alcohol use: No  . Drug use: No     Allergies   Patient has no known allergies.   Review of Systems Review of Systems  All other systems reviewed and are negative.    Physical Exam Updated Vital Signs BP 128/72   Pulse 76   Temp 97.8 F (36.6 C) (Oral)   Resp (!) 22   Ht 5\' 6"  (1.676 m)   Wt 59.9 kg   SpO2 97%   BMI 21.31 kg/m   Physical Exam Vitals signs and nursing note reviewed.  Constitutional:      Appearance: He is well-developed.     Comments: nad  HENT:     Head: Normocephalic and atraumatic.  Eyes:     Conjunctiva/sclera: Conjunctivae normal.  Neck:     Musculoskeletal: Neck supple.  Cardiovascular:     Rate and Rhythm: Normal rate and regular rhythm.  Pulmonary:  Effort: Pulmonary effort is normal.     Breath sounds: Normal breath sounds.  Abdominal:     General: Bowel sounds are normal.     Palpations: Abdomen is soft.  Musculoskeletal: Normal range of motion.  Skin:    General: Skin is warm and dry.  Neurological:     Mental Status: He is alert and oriented to person, place, and time.  Psychiatric:        Behavior: Behavior normal.      ED Treatments / Results  Labs (all labs ordered are listed, but only abnormal results are displayed) Labs Reviewed  URINALYSIS, ROUTINE W REFLEX MICROSCOPIC - Abnormal; Notable for the following components:      Result Value   Hgb urine dipstick SMALL (*)    All other components within normal limits  CBC WITH DIFFERENTIAL/PLATELET  BASIC METABOLIC PANEL  TROPONIN I  TSH  CBG MONITORING, ED    EKG EKG  Interpretation  Date/Time:  Friday November 06 2018 20:47:48 EST Ventricular Rate:  72 PR Interval:    QRS Duration: 101 QT Interval:  421 QTC Calculation: 461 R Axis:   25 Text Interpretation:  Sinus rhythm Abnormal R-wave progression, early transition Baseline wander in lead(s) V6 Confirmed by Nat Christen [id number]) on 11/06/2018 9:03:36 PM   Radiology No results found.  Procedures Procedures (including critical care time)  Medications Ordered in ED Medications - No data to display   Initial Impression / Assessment and Plan / ED Course  I have reviewed the triage vital signs and the nursing notes.  Pertinent labs & imaging results that were available during my care of the patient were reviewed by me and considered in my medical decision making (see chart for details).     Patient is in no acute distress.  EKG normal sinus rhythm.  Basic labs pending.  Discussed with Dr. Rogene Houston.  Final Clinical Impressions(s) / ED Diagnoses   Final diagnoses:  [facility]    ED Discharge Orders    None       Nat Christen, MD 11/06/18 2156

## 2018-11-06 NOTE — ED Triage Notes (Signed)
Pt states CBG at home was 186. Hasn't been "feeling" right. Normally CBG is around 90. Drinking a Pepsi in triage. Has never been diagnosed with diabetes, but states it runs in his family.

## 2018-11-06 NOTE — Discharge Instructions (Signed)
Tests were normal.  Follow-up with your primary care doctor.

## 2018-11-06 NOTE — ED Notes (Signed)
EKG handed to Dr Lacinda Axon

## 2018-11-07 LAB — TSH: TSH: 2.956 u[IU]/mL (ref 0.350–4.500)

## 2018-11-16 DIAGNOSIS — Z79891 Long term (current) use of opiate analgesic: Secondary | ICD-10-CM | POA: Diagnosis not present

## 2018-11-16 DIAGNOSIS — R739 Hyperglycemia, unspecified: Secondary | ICD-10-CM | POA: Diagnosis not present

## 2018-11-16 DIAGNOSIS — J301 Allergic rhinitis due to pollen: Secondary | ICD-10-CM | POA: Diagnosis not present

## 2018-11-16 DIAGNOSIS — M545 Low back pain: Secondary | ICD-10-CM | POA: Diagnosis not present

## 2018-11-16 LAB — HEMOGLOBIN A1C: Hemoglobin A1C: 5.4

## 2018-12-07 ENCOUNTER — Emergency Department (HOSPITAL_COMMUNITY)
Admission: EM | Admit: 2018-12-07 | Discharge: 2018-12-07 | Disposition: A | Discharge status: Home / Self Care | Payer: Medicare Other | Attending: Emergency Medicine | Admitting: Emergency Medicine

## 2018-12-07 ENCOUNTER — Emergency Department (HOSPITAL_COMMUNITY): Payer: Medicare Other

## 2018-12-07 ENCOUNTER — Other Ambulatory Visit: Payer: Self-pay

## 2018-12-07 ENCOUNTER — Encounter (HOSPITAL_COMMUNITY): Payer: Self-pay

## 2018-12-07 DIAGNOSIS — F17210 Nicotine dependence, cigarettes, uncomplicated: Secondary | ICD-10-CM | POA: Diagnosis not present

## 2018-12-07 DIAGNOSIS — M25531 Pain in right wrist: Secondary | ICD-10-CM | POA: Diagnosis not present

## 2018-12-07 DIAGNOSIS — Y939 Activity, unspecified: Secondary | ICD-10-CM | POA: Insufficient documentation

## 2018-12-07 DIAGNOSIS — Y999 Unspecified external cause status: Secondary | ICD-10-CM | POA: Insufficient documentation

## 2018-12-07 DIAGNOSIS — Z79899 Other long term (current) drug therapy: Secondary | ICD-10-CM | POA: Diagnosis not present

## 2018-12-07 DIAGNOSIS — W19XXXA Unspecified fall, initial encounter: Secondary | ICD-10-CM | POA: Insufficient documentation

## 2018-12-07 DIAGNOSIS — Y929 Unspecified place or not applicable: Secondary | ICD-10-CM | POA: Diagnosis not present

## 2018-12-07 DIAGNOSIS — S63501A Unspecified sprain of right wrist, initial encounter: Secondary | ICD-10-CM | POA: Diagnosis not present

## 2018-12-07 DIAGNOSIS — S6991XA Unspecified injury of right wrist, hand and finger(s), initial encounter: Secondary | ICD-10-CM | POA: Diagnosis not present

## 2018-12-07 MED ORDER — IBUPROFEN 800 MG PO TABS: 800.0000 mg | ORAL_TABLET | Freq: Three times a day (TID) | ORAL | 0 refills | Status: DC

## 2018-12-07 MED ORDER — IBUPROFEN 800 MG PO TABS
800.0000 mg | ORAL_TABLET | Freq: Once | ORAL | Status: AC
  Administered 2018-12-07: 800 mg via ORAL
  Filled 2018-12-07: qty 1

## 2018-12-07 NOTE — ED Triage Notes (Signed)
Pt fell in yard yesterday and landed on right wrist. Denies LOC. Right wrist swollen.

## 2018-12-07 NOTE — Discharge Instructions (Signed)
Continue your current medication.

## 2018-12-07 NOTE — ED Provider Notes (Addendum)
[place] Provider Note   CSN: [id number] Arrival date & time: 12/07/18  1525    History   Chief Complaint Chief Complaint  Patient presents with  . Wrist Pain    HPI Keith Nolan is a 48 y.o. male.     The history is provided by the patient. No language interpreter was used.  Wrist Pain  This is a new problem. The current episode started yesterday. The problem occurs constantly. The problem has been gradually worsening. Nothing aggravates the symptoms. Nothing relieves the symptoms. He has tried nothing for the symptoms.  Pt reports he fell yesterday and landed with his weight on his wrist.   Past Medical History:  Diagnosis Date  . DJD (degenerative joint disease)   . Seasonal allergies     Patient Active Problem List   Diagnosis Date Noted  . 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 JOINT EFFUSION, LEFT KNEE 10/25/2010    History reviewed. No pertinent surgical history.      Home Medications    Prior to Admission medications   Medication Sig Start Date End Date Taking? Authorizing Provider  alprazolam Duanne Moron) 2 MG tablet Take 2 mg by mouth every 6 (six) hours as needed for anxiety.     [provider]  clindamycin (CLEOCIN) 300 MG capsule Take 1 capsule (300 mg total) by mouth 4 (four) times daily. 10/27/16   Evalee Jefferson, PA-C  diclofenac (VOLTAREN) 75 MG EC tablet Take 1 tablet (75 mg total) by mouth 2 (two) times daily. Take with food 10/01/16   Triplett, Tammy, PA-C  febuxostat (ULORIC) 40 MG tablet Take 40 mg by mouth at bedtime.     [provider]  HYDROcodone-acetaminophen (NORCO) 7.5-325 MG tablet Take 1 tablet by mouth 4 (four) times daily. 09/11/16   [provider]  ibuprofen (ADVIL,MOTRIN) 800 MG tablet Take 1 tablet (800 mg total) by mouth 3 (three) times daily. 12/07/18   Fransico Meadow, PA-C  traMADol (ULTRAM) 50 MG tablet Take 1 tablet (50  mg total) by mouth every 6 (six) hours as needed. 10/27/16   Evalee Jefferson, PA-C    Family History No family history on file.  Social History Social History   Tobacco Use  . Smoking status: Current Every Day Smoker    Packs/day: 0.50    Years: 30.00    Pack years: 15.00    Types: Cigarettes  . Smokeless tobacco: Never Used  . Tobacco comment: vaping  Substance Use Topics  . Alcohol use: No  . Drug use: No     Allergies   Patient has no known allergies.   Review of Systems Review of Systems  All other systems reviewed and are negative.    Physical Exam Updated Vital Signs BP 135/76 (BP Location: Left Arm)   Pulse 98   Temp 97.8 F (36.6 C) (Oral)   Resp 18   Ht 5\' 6"  (1.676 m)   Wt 60.8 kg   SpO2 96%   BMI 21.63 kg/m   Physical Exam Vitals signs and nursing note reviewed.  Constitutional:      Appearance: He is well-developed.  HENT:     Head: Normocephalic.  Neck:     Musculoskeletal: Normal range of motion.  Pulmonary:     Effort: Pulmonary effort is normal.  Abdominal:     General: There is no distension.  Musculoskeletal:        General: Swelling and tenderness present. No  deformity.     Comments: Tender left wrist ulna aspect,  No deformity nv and ns intact  Skin:    General: Skin is warm.  Neurological:     Mental Status: He is alert and oriented to person, place, and time.      ED Treatments / Results  Labs (all labs ordered are listed, but only abnormal results are displayed) Labs Reviewed - No data to display  EKG None  Radiology Dg Wrist Complete Right  Result Date: 12/07/2018 CLINICAL DATA:  Pain following fall EXAM: RIGHT WRIST - COMPLETE 3+ VIEW COMPARISON:  February 07, 2016 FINDINGS: Frontal, oblique, lateral, and ulnar deviation scaphoid images were obtained. No fracture or dislocation. Joint spaces appear normal. No erosive change. IMPRESSION: No fracture or dislocation.  No appreciable arthropathy. Electronically Signed   By:  Lowella Grip III M.D.   On: 12/07/2018 16:13    Procedures Procedures (including critical care time)  Medications Ordered in ED Medications - No data to display   Initial Impression / Assessment and Plan / ED Course  I have reviewed the triage vital signs and the nursing notes.  Pertinent labs & imaging results that were available during my care of the patient were reviewed by me and considered in my medical decision making (see chart for details).        MDM Number of Diagnoses or Management Options Sprain of right wrist, initial encounter:   Xrays reviewed and discussed with pt.  Pt placed in a splint and advised to follow up with Orthopaedist for recheck.  Final Clinical Impressions(s) / ED Diagnoses   Final diagnoses:  Sprain of right wrist, initial encounter    ED Discharge Orders         Ordered    ibuprofen (ADVIL,MOTRIN) 800 MG tablet  3 times daily     12/07/18 1756    Wrist splint     12/07/18 1756           Fransico Meadow, PA-C 12/07/18 1800    [name] 12/07/18 1809    Hayden Rasmussen, MD 12/08/18 [phone]

## 2018-12-14 DIAGNOSIS — E119 Type 2 diabetes mellitus without complications: Secondary | ICD-10-CM | POA: Diagnosis not present

## 2018-12-18 ENCOUNTER — Ambulatory Visit: Payer: Self-pay | Admitting: Orthopedic Surgery

## 2018-12-18 ENCOUNTER — Encounter: Payer: Self-pay | Admitting: Orthopedic Surgery

## 2018-12-18 NOTE — Progress Notes (Deleted)
  NEW PATIENT OFFICE VISIT  No chief complaint on file.   48 YO MALE PRESENTS WITH RIGHT WRIST PAIN   LOCATION DURATION QUALITY SEVERITY ASSOCIATED WITH  MODIFYING FACTORS  CONTEXT   ROS   Past Medical History:  Diagnosis Date  . DJD (degenerative joint disease)   . Seasonal allergies     No past surgical history on file.  No family history on file. Social History   Tobacco Use  . Smoking status: Current Every Day Smoker    Packs/day: 0.50    Years: 30.00    Pack years: 15.00    Types: Cigarettes  . Smokeless tobacco: Never Used  . Tobacco comment: vaping  Substance Use Topics  . Alcohol use: No  . Drug use: No    No Known Allergies  No outpatient medications have been marked as taking for the 12/18/18 encounter (Appointment) with Carole Civil, MD.    There were no vitals taken for this visit.  Physical Exam  Ortho Exam    MEDICAL DECISION SECTION  Xrays were done at ***  My independent reading of xrays:  ***  No diagnosis found.  PLAN: (Rx., injectx, surgery, frx, mri/ct) ***  No orders of the defined types were placed in this encounter.   Arther Abbott, MD  12/18/2018 9:51 AM

## 2019-01-13 DIAGNOSIS — J449 Chronic obstructive pulmonary disease, unspecified: Secondary | ICD-10-CM | POA: Diagnosis not present

## 2019-01-13 DIAGNOSIS — G4700 Insomnia, unspecified: Secondary | ICD-10-CM | POA: Diagnosis not present

## 2019-01-13 DIAGNOSIS — M545 Low back pain: Secondary | ICD-10-CM | POA: Diagnosis not present

## 2019-03-02 ENCOUNTER — Encounter (HOSPITAL_COMMUNITY): Payer: Self-pay | Admitting: *Deleted

## 2019-03-02 ENCOUNTER — Other Ambulatory Visit: Payer: Self-pay

## 2019-03-02 ENCOUNTER — Emergency Department (HOSPITAL_COMMUNITY)
Admission: EM | Admit: 2019-03-02 | Discharge: 2019-03-02 | Disposition: A | Discharge status: Home / Self Care | Payer: Medicare Other | Attending: Emergency Medicine | Admitting: Emergency Medicine

## 2019-03-02 DIAGNOSIS — F17210 Nicotine dependence, cigarettes, uncomplicated: Secondary | ICD-10-CM | POA: Insufficient documentation

## 2019-03-02 DIAGNOSIS — Z79899 Other long term (current) drug therapy: Secondary | ICD-10-CM | POA: Insufficient documentation

## 2019-03-02 DIAGNOSIS — K0889 Other specified disorders of teeth and supporting structures: Secondary | ICD-10-CM | POA: Diagnosis present

## 2019-03-02 DIAGNOSIS — K029 Dental caries, unspecified: Secondary | ICD-10-CM | POA: Insufficient documentation

## 2019-03-02 DIAGNOSIS — K047 Periapical abscess without sinus: Secondary | ICD-10-CM | POA: Diagnosis not present

## 2019-03-02 MED ORDER — NAPROXEN 500 MG PO TABS: 500.0000 mg | ORAL_TABLET | Freq: Two times a day (BID) | ORAL | 0 refills | Status: DC

## 2019-03-02 MED ORDER — KETOROLAC TROMETHAMINE 60 MG/2ML IM SOLN
60.0000 mg | Freq: Once | INTRAMUSCULAR | Status: AC
  Administered 2019-03-02: 60 mg via INTRAMUSCULAR
  Filled 2019-03-02: qty 2

## 2019-03-02 MED ORDER — AMOXICILLIN 250 MG PO CAPS
1000.0000 mg | ORAL_CAPSULE | Freq: Once | ORAL | Status: AC
  Administered 2019-03-02: 1000 mg via ORAL
  Filled 2019-03-02: qty 4

## 2019-03-02 MED ORDER — AMOXICILLIN 500 MG PO CAPS: 500.0000 mg | ORAL_CAPSULE | Freq: Three times a day (TID) | ORAL | 0 refills | Status: DC

## 2019-03-02 NOTE — ED Triage Notes (Signed)
Pt with dental pain and HA since yesterday.

## 2019-03-02 NOTE — ED Notes (Signed)
Instructed pt to take all of antibiotics as prescribed. 

## 2019-03-02 NOTE — Discharge Instructions (Signed)
Please follow-up with Dr. Sena Hitch wooden, phone [phone]   Take amoxicillin 3 times daily for 10 days, naproxen twice daily as needed for pain  If you she develop severe or worsening pain swelling or fever you may return to the emergency department for repeat evaluation.

## 2019-03-02 NOTE — ED Provider Notes (Signed)
[facility] EMERGENCY DEPARTMENT Provider Note   CSN: [id number] Arrival date & time: 03/02/19  1247    History   Chief Complaint Chief Complaint  Patient presents with  . Headache    dental pain    HPI Keith Nolan is a 48 y.o. male.     HPI  The patient is a pleasant 48 year old male, has a history of severe disease of his dentition, he has had multiple teeth which have been broken down to the gums, he has not seen a dentist in a while but after chewing on a chocolate bar yesterday he noticed that 1 of his teeth broke and about an hour afterwards he started to develop increasing pain in his upper jaw feeling this pain radiating up into his bilateral eyes and the forehead.  No fevers, no swelling of the jaw, throat, difficulty breathing, difficulty swallowing or difficulty speaking.  The symptoms are persistent overnight, he has taken ibuprofen with minimal improvement lasting no more than 1 hour.  He called his dentist who told him to go to [place] to a different practice, he was unable to get in there, called his medical doctor, informed that he needed to come here.  Past Medical History:  Diagnosis Date  . DJD (degenerative joint disease)   . Seasonal allergies     Patient Active Problem List   Diagnosis Date Noted  . 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 JOINT EFFUSION, LEFT KNEE 10/25/2010    History reviewed. No pertinent surgical history.      Home Medications    Prior to Admission medications   Medication Sig Start Date End Date Taking? Authorizing Provider  alprazolam Duanne Moron) 2 MG tablet Take 2 mg by mouth every 6 (six) hours as needed for anxiety.     [provider]  amoxicillin (AMOXIL) 500 MG capsule Take 1 capsule (500 mg total) by mouth 3 (three) times daily. 03/02/19   Noemi Chapel, MD  clindamycin (CLEOCIN) 300 MG capsule Take 1 capsule (300 mg total) by mouth 4 (four) times  daily. 10/27/16   Evalee Jefferson, PA-C  diclofenac (VOLTAREN) 75 MG EC tablet Take 1 tablet (75 mg total) by mouth 2 (two) times daily. Take with food 10/01/16   Triplett, Tammy, PA-C  febuxostat (ULORIC) 40 MG tablet Take 40 mg by mouth at bedtime.     [provider]  HYDROcodone-acetaminophen (NORCO) 7.5-325 MG tablet Take 1 tablet by mouth 4 (four) times daily. 09/11/16   [provider]  ibuprofen (ADVIL,MOTRIN) 800 MG tablet Take 1 tablet (800 mg total) by mouth 3 (three) times daily. 12/07/18   Fransico Meadow, PA-C  naproxen (NAPROSYN) 500 MG tablet Take 1 tablet (500 mg total) by mouth 2 (two) times daily with a meal. 03/02/19   Noemi Chapel, MD  traMADol (ULTRAM) 50 MG tablet Take 1 tablet (50 mg total) by mouth every 6 (six) hours as needed. 10/27/16   Evalee Jefferson, PA-C    Family History History reviewed. No pertinent family history.  Social History Social History   Tobacco Use  . Smoking status: Current Every Day Smoker    Packs/day: 0.50    Years: 30.00    Pack years: 15.00    Types: Cigarettes  . Smokeless tobacco: Never Used  . Tobacco comment: vaping  Substance Use Topics  . Alcohol use: No  . Drug use: No     Allergies   Patient has no known allergies.  Review of Systems Review of Systems  Constitutional: Negative for chills and fever.  HENT: Positive for dental problem. Negative for facial swelling, sore throat, trouble swallowing and voice change.        Toothache  Gastrointestinal: Negative for nausea and vomiting.     Physical Exam Updated Vital Signs BP (!) 161/86 (BP Location: Left Arm)   Pulse 64   Temp 97.6 F (36.4 C) (Oral)   Resp 18   Ht 1.676 m (5\' 6" )   Wt 59.9 kg   SpO2 99%   BMI 21.31 kg/m   Physical Exam Vitals signs and nursing note reviewed.  Constitutional:      General: He is not in acute distress.    Appearance: He is well-developed. He is not diaphoretic.  HENT:     Head: Normocephalic and atraumatic.      Comments: There is no swelling of the jaw, there is no tenderness over the maxilla, mandible, zygomatic arches or in the periorbital structures.  He has multiple areas of poor dentition including multiple teeth which have been degraded down to the gumline.  There is some tenderness over the upper gums in the right upper area second to the canine and the right upper lateral incisor.  Normal speech, normal phonation, normal tongue protrusion    Mouth/Throat:     Pharynx: No oropharyngeal exudate.  Eyes:     General: No scleral icterus.    Conjunctiva/sclera: Conjunctivae normal.  Neck:     Musculoskeletal: Normal range of motion and neck supple.     Thyroid: No thyromegaly.  Cardiovascular:     Rate and Rhythm: Normal rate and regular rhythm.  Pulmonary:     Effort: Pulmonary effort is normal.     Breath sounds: Normal breath sounds.  Lymphadenopathy:     Cervical: No cervical adenopathy.  Skin:    General: Skin is warm and dry.     Findings: No rash.  Neurological:     Mental Status: He is alert.      ED Treatments / Results  Labs (all labs ordered are listed, but only abnormal results are displayed) Labs Reviewed - No data to display  EKG None  Radiology No results found.  Procedures Procedures (including critical care time)  Medications Ordered in ED Medications  ketorolac (TORADOL) injection 60 mg (has no administration in time range)  amoxicillin (AMOXIL) capsule 1,000 mg (has no administration in time range)     Initial Impression / Assessment and Plan / ED Course  I have reviewed the triage vital signs and the nursing notes.  Pertinent labs & imaging results that were available during my care of the patient were reviewed by me and considered in my medical decision making (see chart for details).        Supple neck, no lymphadenopathy, no signs of Ludwig's angina, no signs of significant abscess that would require imaging and drainage.  Will start with  antibiotics and a anti-inflammatory shot.  He was informed that we do not use narcotic medications for these types of illnesses, he will be referred back to his dentist but will start treatment here with amoxicillin, discharged on naproxen.  Given f/u information for other dentist.  The patient has no neurologic deficits, he has normal speech gait and coordination.  He has no signs of palpable abscesses or facial asymmetry, no trismus or torticollis and no areas of the posterior pharynx which have been involved.  Doubt retropharyngeal abscess or other severe process.  Final Clinical Impressions(s) / ED Diagnoses   Final diagnoses:  Dental abscess  Caries involving multiple surfaces of tooth    ED Discharge Orders         Ordered    naproxen (NAPROSYN) 500 MG tablet  2 times daily with meals     03/02/19 1311    amoxicillin (AMOXIL) 500 MG capsule  3 times daily     03/02/19 1311           Noemi Chapel, MD 03/02/19 1313

## 2019-03-02 NOTE — ED Notes (Signed)
ED Provider at bedside. 

## 2019-03-03 DIAGNOSIS — K0889 Other specified disorders of teeth and supporting structures: Secondary | ICD-10-CM | POA: Diagnosis not present

## 2019-03-04 DIAGNOSIS — J45909 Unspecified asthma, uncomplicated: Secondary | ICD-10-CM | POA: Diagnosis not present

## 2019-03-04 DIAGNOSIS — M545 Low back pain: Secondary | ICD-10-CM | POA: Diagnosis not present

## 2019-03-04 DIAGNOSIS — J301 Allergic rhinitis due to pollen: Secondary | ICD-10-CM | POA: Diagnosis not present

## 2019-03-08 DIAGNOSIS — M545 Low back pain: Secondary | ICD-10-CM | POA: Diagnosis not present

## 2019-03-16 DIAGNOSIS — E119 Type 2 diabetes mellitus without complications: Secondary | ICD-10-CM | POA: Diagnosis not present

## 2019-04-07 ENCOUNTER — Emergency Department (HOSPITAL_COMMUNITY)
Admission: EM | Admit: 2019-04-07 | Discharge: 2019-04-07 | Disposition: A | Discharge status: Home / Self Care | Payer: Medicare Other | Attending: Emergency Medicine | Admitting: Emergency Medicine

## 2019-04-07 ENCOUNTER — Encounter (HOSPITAL_COMMUNITY): Payer: Self-pay | Admitting: Emergency Medicine

## 2019-04-07 ENCOUNTER — Other Ambulatory Visit: Payer: Self-pay

## 2019-04-07 DIAGNOSIS — Z79899 Other long term (current) drug therapy: Secondary | ICD-10-CM | POA: Insufficient documentation

## 2019-04-07 DIAGNOSIS — F17210 Nicotine dependence, cigarettes, uncomplicated: Secondary | ICD-10-CM | POA: Diagnosis not present

## 2019-04-07 DIAGNOSIS — K0889 Other specified disorders of teeth and supporting structures: Secondary | ICD-10-CM | POA: Diagnosis not present

## 2019-04-07 MED ORDER — CLINDAMYCIN HCL 300 MG PO CAPS: 300.0000 mg | ORAL_CAPSULE | Freq: Four times a day (QID) | ORAL | 0 refills | Status: AC

## 2019-04-07 MED ORDER — CLINDAMYCIN HCL 150 MG PO CAPS
300.0000 mg | ORAL_CAPSULE | Freq: Once | ORAL | Status: AC
  Administered 2019-04-07: 300 mg via ORAL
  Filled 2019-04-07: qty 2

## 2019-04-07 MED ORDER — KETOROLAC TROMETHAMINE 60 MG/2ML IM SOLN: 30.0000 mg | Freq: Once | INTRAMUSCULAR | 0 refills | Status: AC

## 2019-04-07 NOTE — ED Triage Notes (Signed)
Chronic dental pain all over. States has apt to get them all pulled next week. No facial swelling noted.

## 2019-04-07 NOTE — ED Provider Notes (Signed)
[facility] EMERGENCY DEPARTMENT Provider Note   CSN: [id number] Arrival date & time: 04/07/19  2134     History   Chief Complaint Chief Complaint  Patient presents with  . Dental Pain    HPI Keith STROHMEIER is a 48 y.o. male.     The history is provided by the patient. No language interpreter was used.  Dental Pain Location:  Generalized Quality:  Aching Severity:  Severe Onset quality:  Gradual Duration:  3 days Timing:  Constant Progression:  Worsening Chronicity:  New Previous work-up:  Dental exam Relieved by:  Nothing Worsened by:  Nothing Ineffective treatments:  None tried Associated symptoms: gum swelling   Risk factors: no alcohol problem     Past Medical History:  Diagnosis Date  . DJD (degenerative joint disease)   . Seasonal allergies     Patient Active Problem List   Diagnosis Date Noted  . 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 JOINT EFFUSION, LEFT KNEE 10/25/2010    History reviewed. No pertinent surgical history.      Home Medications    Prior to Admission medications   Medication Sig Start Date End Date Taking? Authorizing Provider  alprazolam Duanne Moron) 2 MG tablet Take 2 mg by mouth every 6 (six) hours as needed for anxiety.     [provider]  amoxicillin (AMOXIL) 500 MG capsule Take 1 capsule (500 mg total) by mouth 3 (three) times daily. 03/02/19   Noemi Chapel, MD  clindamycin (CLEOCIN) 300 MG capsule Take 1 capsule (300 mg total) by mouth 4 (four) times daily for 10 days. 04/07/19 04/17/19  Fransico Meadow, PA-C  diclofenac (VOLTAREN) 75 MG EC tablet Take 1 tablet (75 mg total) by mouth 2 (two) times daily. Take with food 10/01/16   Triplett, Tammy, PA-C  febuxostat (ULORIC) 40 MG tablet Take 40 mg by mouth at bedtime.     [provider]  HYDROcodone-acetaminophen (NORCO) 7.5-325 MG tablet Take 1 tablet by mouth 4 (four) times daily. 09/11/16   [provider]  ibuprofen (ADVIL,MOTRIN) 800 MG tablet Take 1 tablet (800 mg total) by mouth 3 (three) times daily. 12/07/18   Fransico Meadow, PA-C  ketorolac (TORADOL) 60 MG/2ML SOLN injection Inject 1 mL (30 mg total) into the muscle once for 1 dose. 04/07/19 04/07/19  Fransico Meadow, PA-C  naproxen (NAPROSYN) 500 MG tablet Take 1 tablet (500 mg total) by mouth 2 (two) times daily with a meal. 03/02/19   Noemi Chapel, MD  traMADol (ULTRAM) 50 MG tablet Take 1 tablet (50 mg total) by mouth every 6 (six) hours as needed. 10/27/16   Evalee Jefferson, PA-C    Family History History reviewed. No pertinent family history.  Social History Social History   Tobacco Use  . Smoking status: Current Every Day Smoker    Packs/day: 0.50    Years: 30.00    Pack years: 15.00    Types: Cigarettes  . Smokeless tobacco: Never Used  . Tobacco comment: vaping  Substance Use Topics  . Alcohol use: No  . Drug use: No     Allergies   Patient has no known allergies.   Review of Systems Review of Systems  All other systems reviewed and are negative.    Physical Exam Updated Vital Signs BP (!) 158/79   Pulse 72   Temp 97.6 F (36.4 C) (Oral)   Resp 18   SpO2 100%   Physical Exam Vitals signs  and nursing note reviewed.  Constitutional:      Appearance: He is well-developed.  HENT:     Head: Normocephalic and atraumatic.     Mouth/Throat:     Comments: Severe dental disease  Eyes:     Conjunctiva/sclera: Conjunctivae normal.  Neck:     Musculoskeletal: Normal range of motion and neck supple.  Cardiovascular:     Rate and Rhythm: Normal rate and regular rhythm.     Heart sounds: No murmur.  Pulmonary:     Effort: Pulmonary effort is normal. No respiratory distress.     Breath sounds: Normal breath sounds.  Abdominal:     Palpations: Abdomen is soft.     Tenderness: There is no abdominal tenderness.  Skin:    General: Skin is warm and dry.  Neurological:     General: No focal deficit  present.     Mental Status: He is alert.  Psychiatric:        Mood and Affect: Mood normal.      ED Treatments / Results  Labs (all labs ordered are listed, but only abnormal results are displayed) Labs Reviewed - No data to display  EKG    Radiology No results found.  Procedures Procedures (including critical care time)  Medications Ordered in ED Medications  clindamycin (CLEOCIN) capsule 300 mg (has no administration in time range)     Initial Impression / Assessment and Plan / ED Course  I have reviewed the triage vital signs and the nursing notes.  Pertinent labs & imaging results that were available during my care of the patient were reviewed by me and considered in my medical decision making (see chart for details).        An After Visit Summary was printed and given to the patient. PMP  Shows pt on hydrocodone per Dr. Luan Pulling  Final Clinical Impressions(s) / ED Diagnoses   Final diagnoses:  Pain, dental    ED Discharge Orders         Ordered    ketorolac (TORADOL) 60 MG/2ML SOLN injection   Once     04/07/19 2204    clindamycin (CLEOCIN) 300 MG capsule  4 times daily     04/07/19 2206           [name] 04/07/19 2208    Milton Ferguson, MD 04/08/19 [phone]

## 2019-04-07 NOTE — Discharge Instructions (Signed)
Continue your current pain medications

## 2019-04-22 DIAGNOSIS — J449 Chronic obstructive pulmonary disease, unspecified: Secondary | ICD-10-CM | POA: Diagnosis not present

## 2019-04-22 DIAGNOSIS — M545 Low back pain: Secondary | ICD-10-CM | POA: Diagnosis not present
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
## 2019-05-18 ENCOUNTER — Other Ambulatory Visit: Payer: Self-pay

## 2019-05-18 ENCOUNTER — Encounter (HOSPITAL_COMMUNITY): Payer: Self-pay | Admitting: Psychiatry

## 2019-05-18 ENCOUNTER — Ambulatory Visit (INDEPENDENT_AMBULATORY_CARE_PROVIDER_SITE_OTHER): Payer: Medicare Other | Admitting: Psychiatry

## 2019-05-18 DIAGNOSIS — F1021 Alcohol dependence, in remission: Secondary | ICD-10-CM | POA: Diagnosis not present

## 2019-05-18 DIAGNOSIS — F4310 Post-traumatic stress disorder, unspecified: Secondary | ICD-10-CM | POA: Diagnosis not present

## 2019-05-18 DIAGNOSIS — F331 Major depressive disorder, recurrent, moderate: Secondary | ICD-10-CM | POA: Diagnosis not present

## 2019-05-18 MED ORDER — QUETIAPINE FUMARATE 25 MG PO TABS: 25.0000 mg | ORAL_TABLET | Freq: Every day | ORAL | 0 refills | Status: DC

## 2019-05-18 MED ORDER — CITALOPRAM HYDROBROMIDE 40 MG PO TABS: 40.0000 mg | ORAL_TABLET | Freq: Every day | ORAL | 0 refills | Status: DC

## 2019-05-18 NOTE — Patient Instructions (Signed)
1. Increase citalopram 40 mg daily  2. Start quetiapine 25 mg at night  3. Continue Xanax 0.25 mg twice a day as needed for anxiety  4. Return to clinic on 8/27 at 11:30  5. Referral to therapy
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
## 2019-06-15 ENCOUNTER — Other Ambulatory Visit (HOSPITAL_COMMUNITY): Payer: Self-pay | Admitting: Psychiatry

## 2019-06-15 DIAGNOSIS — E119 Type 2 diabetes mellitus without complications: Secondary | ICD-10-CM | POA: Diagnosis not present

## 2019-06-15 MED ORDER — CITALOPRAM HYDROBROMIDE 40 MG PO TABS: 40.0000 mg | ORAL_TABLET | Freq: Every day | ORAL | 0 refills | Status: DC

## 2019-06-17 ENCOUNTER — Other Ambulatory Visit: Payer: Self-pay

## 2019-06-17 ENCOUNTER — Encounter (HOSPITAL_COMMUNITY): Payer: Self-pay | Admitting: Psychiatry

## 2019-06-17 ENCOUNTER — Ambulatory Visit (INDEPENDENT_AMBULATORY_CARE_PROVIDER_SITE_OTHER): Payer: Medicare Other | Admitting: Psychiatry

## 2019-06-17 DIAGNOSIS — F331 Major depressive disorder, recurrent, moderate: Secondary | ICD-10-CM | POA: Diagnosis not present

## 2019-06-17 DIAGNOSIS — F4310 Post-traumatic stress disorder, unspecified: Secondary | ICD-10-CM | POA: Diagnosis not present

## 2019-06-17 DIAGNOSIS — F1021 Alcohol dependence, in remission: Secondary | ICD-10-CM

## 2019-06-17 MED ORDER — SERTRALINE HCL 50 MG PO TABS: ORAL_TABLET | ORAL | 1 refills | Status: DC

## 2019-06-17 MED ORDER — HYDROXYZINE PAMOATE 25 MG PO CAPS: 25.0000 mg | ORAL_CAPSULE | Freq: Every day | ORAL | 1 refills | Status: DC | PRN

## 2019-06-17 NOTE — Patient Instructions (Signed)
1. Decrease citalopram 20 mg daily for one week, then discontinue  2. Start sertraline 25 mg daily for one week, then increase 50 mg daily  3. Discontinue quetiapine  4. Start hydroxyzine 25 mg daily as needed for anxiety 5. Next appointment: 10/6 at 10:30

## 2019-07-12 ENCOUNTER — Telehealth (HOSPITAL_COMMUNITY): Payer: Self-pay | Admitting: *Deleted

## 2019-07-12 NOTE — Telephone Encounter (Signed)
According to the database, Xanax 0.25 mg was last dispensed, one tab daily for 60 days by PCP. I can order refill so that he gets medication after 10/10 (which I will order at his next visit).

## 2019-07-12 NOTE — Telephone Encounter (Signed)
SPOKE WITH PATIENT & INFORMED PER PROVIDER: According to the database, Xanax 0.25 mg was last dispensed, one tab daily for 60 days by PCP. I can order refill so that he gets medication after 10/10 (which I will order at his next visit).    PATIENT STATED THAT IT'S NOT A 60 DAY BECAUSE HE TAKES 0.25 MG 4 X DAILY .SO YES HE STATES HE WOULD LIKE TO GET THAT REFILL THAT LAST UNTIL 10/10. NEXT APPOINTMENT  IS 07/23/2019

## 2019-07-12 NOTE — Telephone Encounter (Signed)
It seems like his PCP ordered medication as 0.25 mg daily as needed only this time. I would not be able to do more refills until the next due date.

## 2019-07-12 NOTE — Telephone Encounter (Signed)
Patient called stating that the his PCP Dr Luan Pulling mailed him a Letter that he received over the weekend stating he would no longer be prescribing him Xanax that his Psychiatrist would be the one to assume that care. Patient is concerned stating that since receiving this Letter his Anxiety & Depression has increase a lot worrying about this.  His question is will you now be ordering him his Xanax?

## 2019-07-12 NOTE — Telephone Encounter (Signed)
PATIENT INFORMED PER PROVIDER:  It seems like his PCP ordered medication as 0.25 mg daily as needed only this time. I would not be able to do more refills until the next due date.
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
## 2019-07-27 ENCOUNTER — Ambulatory Visit (INDEPENDENT_AMBULATORY_CARE_PROVIDER_SITE_OTHER): Payer: Medicare Other | Admitting: Psychiatry

## 2019-07-27 ENCOUNTER — Encounter (HOSPITAL_COMMUNITY): Payer: Self-pay | Admitting: Psychiatry

## 2019-07-27 ENCOUNTER — Other Ambulatory Visit: Payer: Self-pay

## 2019-07-27 DIAGNOSIS — F1021 Alcohol dependence, in remission: Secondary | ICD-10-CM | POA: Diagnosis not present

## 2019-07-27 DIAGNOSIS — F4310 Post-traumatic stress disorder, unspecified: Secondary | ICD-10-CM | POA: Diagnosis not present

## 2019-07-27 DIAGNOSIS — F331 Major depressive disorder, recurrent, moderate: Secondary | ICD-10-CM

## 2019-07-27 MED ORDER — ALPRAZOLAM 0.25 MG PO TABS: 0.2500 mg | ORAL_TABLET | Freq: Two times a day (BID) | ORAL | 2 refills | Status: AC | PRN

## 2019-07-27 MED ORDER — SERTRALINE HCL 100 MG PO TABS: 100.0000 mg | ORAL_TABLET | Freq: Every day | ORAL | 0 refills | Status: DC

## 2019-07-27 NOTE — Patient Instructions (Addendum)
1. Increase sertraline 100 mg daily  2. Continue Xanax 0.25 mg twice a day as needed for anxiety 3. Discontinue hydroxyzine 4. Next appointment: in December

## 2019-09-06 ENCOUNTER — Ambulatory Visit: Payer: Medicare Other | Admitting: Family Medicine

## 2019-09-14 DIAGNOSIS — E119 Type 2 diabetes mellitus without complications: Secondary | ICD-10-CM | POA: Diagnosis not present

## 2019-09-28 ENCOUNTER — Ambulatory Visit (HOSPITAL_COMMUNITY): Payer: Medicare Other | Admitting: Psychiatry

## 2019-10-11 ENCOUNTER — Other Ambulatory Visit (HOSPITAL_COMMUNITY): Payer: Self-pay | Admitting: Psychiatry

## 2019-10-11 MED ORDER — SERTRALINE HCL 100 MG PO TABS: 100.0000 mg | ORAL_TABLET | Freq: Every day | ORAL | 0 refills | Status: DC

## 2019-11-21 ENCOUNTER — Encounter (HOSPITAL_COMMUNITY): Payer: Self-pay | Admitting: Emergency Medicine

## 2019-11-21 ENCOUNTER — Emergency Department (HOSPITAL_COMMUNITY)
Admission: EM | Admit: 2019-11-21 | Discharge: 2019-11-21 | Disposition: A | Discharge status: Home / Self Care | Payer: Medicare Other | Attending: Emergency Medicine | Admitting: Emergency Medicine

## 2019-11-21 ENCOUNTER — Other Ambulatory Visit: Payer: Self-pay

## 2019-11-21 DIAGNOSIS — Z79899 Other long term (current) drug therapy: Secondary | ICD-10-CM | POA: Insufficient documentation

## 2019-11-21 DIAGNOSIS — N390 Urinary tract infection, site not specified: Secondary | ICD-10-CM | POA: Insufficient documentation

## 2019-11-21 DIAGNOSIS — R300 Dysuria: Secondary | ICD-10-CM | POA: Diagnosis present

## 2019-11-21 DIAGNOSIS — F17210 Nicotine dependence, cigarettes, uncomplicated: Secondary | ICD-10-CM | POA: Diagnosis not present

## 2019-11-21 DIAGNOSIS — J449 Chronic obstructive pulmonary disease, unspecified: Secondary | ICD-10-CM | POA: Diagnosis not present

## 2019-11-21 LAB — URINALYSIS, ROUTINE W REFLEX MICROSCOPIC
Bilirubin Urine: NEGATIVE
Glucose, UA: NEGATIVE mg/dL
Ketones, ur: NEGATIVE mg/dL
Nitrite: NEGATIVE
Protein, ur: NEGATIVE mg/dL
Specific Gravity, Urine: 1.011 (ref 1.005–1.030)
pH: 6 (ref 5.0–8.0)

## 2019-11-21 LAB — CBG MONITORING, ED: Glucose-Capillary: 94 mg/dL (ref 70–99)

## 2019-11-21 MED ORDER — CEPHALEXIN 500 MG PO CAPS
500.0000 mg | ORAL_CAPSULE | Freq: Once | ORAL | Status: AC
  Administered 2019-11-21: 500 mg via ORAL
  Filled 2019-11-21: qty 1

## 2019-11-21 MED ORDER — CEPHALEXIN 500 MG PO CAPS: 500.0000 mg | ORAL_CAPSULE | Freq: Four times a day (QID) | ORAL | 0 refills | Status: DC

## 2019-11-21 NOTE — ED Triage Notes (Signed)
Patient complains of burning with urination for the past 3 days.

## 2019-11-21 NOTE — Discharge Instructions (Addendum)
It is important to drink water.  Take the antibiotic as directed until its finished.  You may call Dr. Denita Lung office to establish primary care.  Return to the emergency department if you develop any worsening symptoms such as increasing pain, fever, or vomiting.

## 2019-11-21 NOTE — ED Notes (Signed)
Culture order for urine   Call to Ed in lab for same

## 2019-11-22 LAB — URINE CULTURE: Culture: NO GROWTH

## 2019-11-22 NOTE — ED Provider Notes (Signed)
Keith Nolan   CSN: [id number] Arrival date & time: 11/21/19  1538     History Chief Complaint  Patient presents with  . Dysuria    DAWSYN KALKMAN is a 49 y.o. male.  HPI      COTTER LIGON is a 49 y.o. male who presents to the Emergency Department complaining of urinary frequency and burning with urination.  Symptoms have been present for 3 to 4 days.  He reports history of previous urinary tract infections and states his current symptoms feel similar to previous.  He describes a pressure type pain to his penis and pelvic region with sensation of urgency.  Pressure briefly resolves after voiding.  He states that he is urinating a normal amount, but his urine appears dark in color.  He denies hematuria, flank pain, fever, chills, nausea, vomiting and abdominal pain.  No history of kidney stones.  He also denies pain or swelling of his penis and testicles, and penile discharge.  He states he is not sexually active at this time.    Past Medical History:  Diagnosis Date  . COPD (chronic obstructive pulmonary disease) ([place])   . DJD (degenerative joint disease)   . Seasonal allergies     Patient Active Problem List   Diagnosis Date Noted  . 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 JOINT EFFUSION, LEFT KNEE 10/25/2010    History reviewed. No pertinent surgical history.     Family History  Problem Relation Age of Onset  . Anxiety disorder Mother   . Depression Mother   . Anxiety disorder Father   . Depression Father   . Alcohol abuse Maternal Grandfather     Social History   Tobacco Use  . Smoking status: Current Every Day Smoker    Packs/day: 0.50    Years: 30.00    Pack years: 15.00    Types: Cigarettes  . Smokeless tobacco: Never Used  . Tobacco comment: vaping  Substance Use Topics  . Alcohol use: No  . Drug use: No    Home Medications Prior to Admission  medications   Medication Sig Start Date End Date Taking? Authorizing Provider  budesonide-formoterol (SYMBICORT) 160-4.5 MCG/ACT inhaler Inhale 2 puffs into the lungs 2 (two) times daily.    [provider]  cephALEXin (KEFLEX) 500 MG capsule Take 1 capsule (500 mg total) by mouth 4 (four) times daily. 11/21/19   Adela Esteban, PA-C  HYDROcodone-acetaminophen (NORCO) 7.5-325 MG tablet Take 1 tablet by mouth 4 (four) times daily. 09/11/16   [provider]  sertraline (ZOLOFT) 100 MG tablet Take 1 tablet (100 mg total) by mouth daily. 10/11/19   Cloria Spring, MD  traZODone (DESYREL) 100 MG tablet Take 100 mg by mouth at bedtime.    [provider]    Allergies    Patient has no known allergies.  Review of Systems   Review of Systems  Constitutional: Negative for activity change, appetite change, chills and fever.  Respiratory: Negative for chest tightness and shortness of breath.   Cardiovascular: Negative for chest pain.  Gastrointestinal: Negative for abdominal pain, diarrhea, nausea and vomiting.  Genitourinary: Positive for dysuria, frequency and urgency. Negative for decreased urine volume, difficulty urinating, discharge, flank pain, hematuria, penile swelling, scrotal swelling and testicular pain.  Musculoskeletal: Negative for back pain.  Skin: Negative for rash.  Neurological: Negative for dizziness, weakness and numbness.  Hematological: Negative for adenopathy.  Psychiatric/Behavioral: Negative for confusion.  Physical Exam Updated Vital Signs BP 134/69 (BP Location: Right Arm)   Pulse 88   Temp 98.3 F (36.8 C) (Oral)   Resp 17   Ht 5\' 6"  (1.676 m)   Wt 59.9 kg   SpO2 99%   BMI 21.31 kg/m   Physical Exam Vitals and nursing Nolan reviewed.  Constitutional:      Appearance: Normal appearance. He is not ill-appearing.  Cardiovascular:     Rate and Rhythm: Normal rate and regular rhythm.     Pulses: Normal pulses.  Pulmonary:      Effort: Pulmonary effort is normal.     Breath sounds: Normal breath sounds.  Chest:     Chest wall: No tenderness.  Abdominal:     General: There is no distension.     Palpations: Abdomen is soft.     Tenderness: There is no abdominal tenderness. There is no right CVA tenderness or left CVA tenderness.  Musculoskeletal:        General: Normal range of motion.  Skin:    General: Skin is warm.     Findings: No rash.  Neurological:     Mental Status: He is alert.     Sensory: No sensory deficit.     Motor: No weakness.     ED Results / Procedures / Treatments   Labs (all labs ordered are listed, but only abnormal results are displayed) Labs Reviewed  URINALYSIS, ROUTINE W REFLEX MICROSCOPIC - Abnormal; Notable for the following components:      Result Value   APPearance HAZY (*)    Hgb urine dipstick SMALL (*)    Leukocytes,Ua MODERATE (*)    Bacteria, UA RARE (*)    All other components within normal limits  URINE CULTURE  CBG MONITORING, ED  GC/CHLAMYDIA PROBE AMP ([place]) NOT AT [facility]    EKG None  Radiology No results found.  Procedures Procedures (including critical care time)  Medications Ordered in ED Medications  cephALEXin (KEFLEX) capsule 500 mg (500 mg Oral Given 11/21/19 1654)    ED Course  I have reviewed the triage vital signs and the nursing notes.  Pertinent labs & imaging results that were available during my care of the patient were reviewed by me and considered in my medical decision making (see chart for details).    MDM Rules/Calculators/A&P                      Well-appearing male with history dysuria symptoms.  No flank pain fever, vomiting, or chills to suggest pyelonephritis.  Doubt kidney stone. No back pain to suggest prostatitis.  He states he had his prostate checked 1 year ago by is PCP and was "normal".  Urinalysis shows likely infection, urine culture obtained and is pending.  GC and Chlamydia culture also obtained although  patient denies being sexually active at this time.  Will treat with antibiotics.  Appears appropriate for d/c home.  Patient advised to follow-up with PCP, return precautions also discussed.   Final Clinical Impression(s) / ED Diagnoses Final diagnoses:  Lower urinary tract infectious disease    Rx / DC Orders ED Discharge Orders         Ordered    cephALEXin (KEFLEX) 500 MG capsule  4 times daily     11/21/19 1643           Kem Parkinson, PA-C 11/22/19 0902    Ezequiel Essex, MD 11/22/19 1140

## 2019-11-23 ENCOUNTER — Other Ambulatory Visit: Payer: Self-pay

## 2019-11-23 ENCOUNTER — Encounter (HOSPITAL_COMMUNITY): Payer: Self-pay | Admitting: *Deleted

## 2019-11-23 ENCOUNTER — Emergency Department (HOSPITAL_COMMUNITY)
Admission: EM | Admit: 2019-11-23 | Discharge: 2019-11-23 | Disposition: A | Discharge status: Home / Self Care | Payer: Medicare Other | Attending: Emergency Medicine | Admitting: Emergency Medicine

## 2019-11-23 DIAGNOSIS — F17210 Nicotine dependence, cigarettes, uncomplicated: Secondary | ICD-10-CM | POA: Diagnosis not present

## 2019-11-23 DIAGNOSIS — J449 Chronic obstructive pulmonary disease, unspecified: Secondary | ICD-10-CM | POA: Diagnosis not present

## 2019-11-23 DIAGNOSIS — Z79899 Other long term (current) drug therapy: Secondary | ICD-10-CM | POA: Diagnosis not present

## 2019-11-23 DIAGNOSIS — A549 Gonococcal infection, unspecified: Secondary | ICD-10-CM | POA: Diagnosis present

## 2019-11-23 LAB — GC/CHLAMYDIA PROBE AMP (~~LOC~~) NOT AT ARMC
Chlamydia: NEGATIVE
Neisseria Gonorrhea: POSITIVE — AB

## 2019-11-23 MED ORDER — CEFTRIAXONE SODIUM 500 MG IJ SOLR
500.0000 mg | Freq: Once | INTRAMUSCULAR | Status: AC
  Administered 2019-11-23: 500 mg via INTRAMUSCULAR
  Filled 2019-11-23: qty 500

## 2019-11-23 MED ORDER — LIDOCAINE HCL (PF) 1 % IJ SOLN
INTRAMUSCULAR | Status: AC
  Filled 2019-11-23: qty 2

## 2019-11-23 NOTE — ED Triage Notes (Signed)
Pt seen PCP and was tested few days ago, pt was having pain with urination.  Pt was called today and told that he had gonorrhea. Pt denies penile discharge. Pt states last sexual intercourse was last year.

## 2019-11-23 NOTE — ED Provider Notes (Addendum)
[facility] EMERGENCY DEPARTMENT Provider Note   CSN: [id number] Arrival date & time: 11/23/19  1704     History Chief Complaint  Patient presents with  . SEXUALLY TRANSMITTED DISEASE    KAYNON POLCZYNSKI is a 49 y.o. male.  HPI      DENNEY WILLY is a 49 y.o. male who presents to the Emergency Department requesting medication for gonorrhea.  He was seen here several days ago by me and treated for a UTI.  His urine was also evaluated for possible STD.  He was contacted regarding his results and told that his gonorrhea test was positive and advised to return to the emergency department for treatment.  Patient states he has not been sexually active in over a year.  He denies penile pain, discharge, or pain to his testicles.  No genital lesions.  He is currently taking Keflex and states his previous dysuria symptoms are improving.  He denies fever, abdominal pain and vomiting.  Past Medical History:  Diagnosis Date  . COPD (chronic obstructive pulmonary disease) ([place])   . DJD (degenerative joint disease)   . Seasonal allergies     Patient Active Problem List   Diagnosis Date Noted  . 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 JOINT EFFUSION, LEFT KNEE 10/25/2010    History reviewed. No pertinent surgical history.     Family History  Problem Relation Age of Onset  . Anxiety disorder Mother   . Depression Mother   . Anxiety disorder Father   . Depression Father   . Alcohol abuse Maternal Grandfather     Social History   Tobacco Use  . Smoking status: Current Every Day Smoker    Packs/day: 0.50    Years: 30.00    Pack years: 15.00    Types: Cigarettes  . Smokeless tobacco: Never Used  . Tobacco comment: vaping  Substance Use Topics  . Alcohol use: No  . Drug use: No    Home Medications Prior to Admission medications   Medication Sig Start Date End Date Taking? Authorizing Provider  budesonide-formoterol  (SYMBICORT) 160-4.5 MCG/ACT inhaler Inhale 2 puffs into the lungs 2 (two) times daily.    [provider]  cephALEXin (KEFLEX) 500 MG capsule Take 1 capsule (500 mg total) by mouth 4 (four) times daily. 11/21/19   Tanaia Hawkey, PA-C  HYDROcodone-acetaminophen (NORCO) 7.5-325 MG tablet Take 1 tablet by mouth 4 (four) times daily. 09/11/16   [provider]  sertraline (ZOLOFT) 100 MG tablet Take 1 tablet (100 mg total) by mouth daily. 10/11/19   Cloria Spring, MD  traZODone (DESYREL) 100 MG tablet Take 100 mg by mouth at bedtime.    [provider]    Allergies    Patient has no known allergies.  Review of Systems   Review of Systems  Constitutional: Negative for chills and fever.  HENT: Negative for sore throat and trouble swallowing.   Respiratory: Negative for cough and shortness of breath.   Cardiovascular: Negative for chest pain.  Gastrointestinal: Negative for abdominal pain, nausea and vomiting.  Genitourinary: Negative for decreased urine volume, discharge, dysuria, flank pain, genital sores, hematuria, penile pain, scrotal swelling and testicular pain.  Musculoskeletal: Negative for arthralgias, back pain, myalgias, neck pain and neck stiffness.  Skin: Negative for rash.  Neurological: Negative for weakness.    Physical Exam Updated Vital Signs BP 125/73   Pulse 78   Temp 98 F (36.7 C)   Resp 18  Ht 5\' 6"  (1.676 m)   Wt 59.9 kg   SpO2 98%   BMI 21.31 kg/m   Physical Exam Vitals and nursing note reviewed.  Constitutional:      Appearance: Normal appearance. He is not ill-appearing.  Cardiovascular:     Rate and Rhythm: Normal rate and regular rhythm.  Pulmonary:     Effort: Pulmonary effort is normal.     Breath sounds: Normal breath sounds. No wheezing.  Abdominal:     Palpations: Abdomen is soft.     Tenderness: There is no abdominal tenderness. There is no right CVA tenderness, left CVA tenderness, guarding or rebound.    Musculoskeletal:        General: Normal range of motion.  Skin:    General: Skin is warm.     Findings: No rash.  Neurological:     General: No focal deficit present.     Mental Status: He is alert.     Motor: No weakness.     ED Results / Procedures / Treatments   Labs (all labs ordered are listed, but only abnormal results are displayed) Labs Reviewed - No data to display  EKG None  Radiology No results found.  Procedures Procedures (including critical care time)  Medications Ordered in ED Medications  cefTRIAXone (ROCEPHIN) injection 500 mg (has no administration in time range)  lidocaine (PF) (XYLOCAINE) 1 % injection (has no administration in time range)    ED Course  I have reviewed the triage vital signs and the nursing notes.  Pertinent labs & imaging results that were available during my care of the patient were reviewed by me and considered in my medical decision making (see chart for details).    MDM Rules/Calculators/A&P                      Pt's chart is not being shared at his request that his medical record be kept confidential.     He is here requesting medication for positive gonorrhea test.  He was seen here previously by me and treated for urinary tract infection.  He is currently taking Keflex.  He states his symptoms are improving he is no longer having dysuria symptoms.  We will treat here with IM Rocephin.  Patient appears appropriate for discharge home, he is currently trying to establish primary care with Dr. Cindie Laroche  Final Clinical Impression(s) / ED Diagnoses Final diagnoses:  Gonorrhea    Rx / DC Orders ED Discharge Orders    None       Kem Parkinson, PA-C 11/23/19 1848    Kem Parkinson, PA-C 11/23/19 1849    Dorie Rank, MD 11/24/19 1100

## 2019-11-23 NOTE — Discharge Instructions (Addendum)
You have been given medication today to treat gonorrhea.  Continue taking your previous antibiotic as directed until it is finished.  Turn to the emergency department if needed.

## 2019-12-16 ENCOUNTER — Encounter (HOSPITAL_COMMUNITY): Payer: Self-pay | Admitting: Emergency Medicine

## 2019-12-16 ENCOUNTER — Emergency Department (HOSPITAL_COMMUNITY): Payer: Medicare Other

## 2019-12-16 ENCOUNTER — Other Ambulatory Visit: Payer: Self-pay

## 2019-12-16 ENCOUNTER — Emergency Department (HOSPITAL_COMMUNITY)
Admission: EM | Admit: 2019-12-16 | Discharge: 2019-12-16 | Disposition: A | Discharge status: Home / Self Care | Payer: Medicare Other | Attending: Emergency Medicine | Admitting: Emergency Medicine

## 2019-12-16 DIAGNOSIS — M79641 Pain in right hand: Secondary | ICD-10-CM | POA: Insufficient documentation

## 2019-12-16 DIAGNOSIS — F17210 Nicotine dependence, cigarettes, uncomplicated: Secondary | ICD-10-CM | POA: Diagnosis not present

## 2019-12-16 DIAGNOSIS — J449 Chronic obstructive pulmonary disease, unspecified: Secondary | ICD-10-CM | POA: Insufficient documentation

## 2019-12-16 DIAGNOSIS — R600 Localized edema: Secondary | ICD-10-CM | POA: Diagnosis not present

## 2019-12-16 NOTE — ED Notes (Signed)
Pt notified of risks of leaving AMA, pt aware of those risks, states he has to get home for doctors app, pt states he will come back tomorrow. Refused blood work ordered. PA notified

## 2019-12-16 NOTE — ED Provider Notes (Signed)
[facility] EMERGENCY DEPARTMENT Provider Note   CSN: [id number] Arrival date & time: 12/16/19  1635     History Chief Complaint  Patient presents with  . Hand Injury    Keith Nolan is a 49 y.o. male.  The history is provided by the patient. No language interpreter was used.  Hand Injury Location:  Hand Hand location:  R hand Upper extremity injury: possibly.   Pain details:    Quality:  Aching   Radiates to:  Does not radiate   Severity:  Moderate   Onset quality:  Gradual   Timing:  Constant Handedness:  Right-handed Dislocation: no   Foreign body present:  No foreign bodies Relieved by:  Nothing Worsened by:  Nothing Ineffective treatments:  None tried Associated symptoms: no numbness   Risk factors: no known bone disorder        Past Medical History:  Diagnosis Date  . COPD (chronic obstructive pulmonary disease) ([place])   . DJD (degenerative joint disease)   . Seasonal allergies     Patient Active Problem List   Diagnosis Date Noted  . 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 JOINT EFFUSION, LEFT KNEE 10/25/2010    History reviewed. No pertinent surgical history.     Family History  Problem Relation Age of Onset  . Anxiety disorder Mother   . Depression Mother   . Anxiety disorder Father   . Depression Father   . Alcohol abuse Maternal Grandfather     Social History   Tobacco Use  . Smoking status: Current Every Day Smoker    Packs/day: 0.50    Years: 30.00    Pack years: 15.00    Types: Cigarettes  . Smokeless tobacco: Never Used  . Tobacco comment: vaping  Substance Use Topics  . Alcohol use: No  . Drug use: No    Home Medications Prior to Admission medications   Medication Sig Start Date End Date Taking? Authorizing Provider  budesonide-formoterol (SYMBICORT) 160-4.5 MCG/ACT inhaler Inhale 2 puffs into the lungs 2 (two) times daily.    [provider]   cephALEXin (KEFLEX) 500 MG capsule Take 1 capsule (500 mg total) by mouth 4 (four) times daily. 11/21/19   Triplett, Tammy, PA-C  HYDROcodone-acetaminophen (NORCO) 7.5-325 MG tablet Take 1 tablet by mouth 4 (four) times daily. 09/11/16   [provider]  sertraline (ZOLOFT) 100 MG tablet Take 1 tablet (100 mg total) by mouth daily. 10/11/19   Cloria Spring, MD  traZODone (DESYREL) 100 MG tablet Take 100 mg by mouth at bedtime.    [provider]    Allergies    Patient has no known allergies.  Review of Systems   Review of Systems  All other systems reviewed and are negative.   Physical Exam Updated Vital Signs BP 129/72 (BP Location: Right Arm)   Pulse 72   Temp 98.1 F (36.7 C) (Oral)   Resp 16   Ht 5\' 6"  (1.676 m)   Wt 55.8 kg   SpO2 100%   BMI 19.85 kg/m   Physical Exam Vitals and nursing note reviewed.  HENT:     Head: Normocephalic.  Cardiovascular:     Rate and Rhythm: Normal rate.  Pulmonary:     Effort: Pulmonary effort is normal.  Musculoskeletal:        General: Swelling and tenderness present.     Comments: Swollen tender right hand,  nv and ns intact  Skin:  General: Skin is warm.  Neurological:     General: No focal deficit present.  Psychiatric:        Mood and Affect: Mood normal.     ED Results / Procedures / Treatments   Labs (all labs ordered are listed, but only abnormal results are displayed) Labs Reviewed  CBC WITH DIFFERENTIAL/PLATELET  BASIC METABOLIC PANEL  URIC ACID    EKG None  Radiology DG Hand Complete Right  Result Date: 12/16/2019 CLINICAL DATA:  Edema. EXAM: RIGHT HAND - COMPLETE 3+ VIEW COMPARISON:  None. FINDINGS: There is nonspecific soft tissue swelling about the right hand. There is no acute displaced fracture. No dislocation. No radiopaque foreign body. IMPRESSION: Nonspecific soft tissue swelling about the right hand. Electronically Signed   By: Constance Holster M.D.   On: 12/16/2019 19:11     Procedures Procedures (including critical care time)  Medications Ordered in ED Medications - No data to display  ED Course  I have reviewed the triage vital signs and the nursing notes.  Pertinent labs & imaging results that were available during my care of the patient were reviewed by me and considered in my medical decision making (see chart for details).    MDM Rules/Calculators/A&P                      MDM:  Pt's xray is normal,  Pt states he can not wait to have labs drawn.  Pt reports he has a zoom Dr's appointment at 8pm and he has to be home. Pt advised he needs to return for evaltuion, lab work and possible iv antibiotics Final Clinical Impression(s) / ED Diagnoses Final diagnoses:  Right hand pain    Rx / DC Orders ED Discharge Orders    None       [name] 12/16/19 Paul Half, MD 01/04/20 [phone]

## 2019-12-16 NOTE — ED Triage Notes (Signed)
Patient with swelling to his R hand, no known injury.

## 2019-12-24 DIAGNOSIS — E119 Type 2 diabetes mellitus without complications: Secondary | ICD-10-CM | POA: Diagnosis not present

## 2020-01-15 ENCOUNTER — Other Ambulatory Visit (HOSPITAL_COMMUNITY): Payer: Self-pay | Admitting: Psychiatry

## 2020-01-17 NOTE — Telephone Encounter (Signed)
Schedule with dr. Modesta Messing

## 2020-01-17 NOTE — Telephone Encounter (Signed)
PER DR ROSS Schedule with dr. Modesta Messing ---- SPOKE WITH PATIENT APPT SCHEDULED FOR 01/18/2020
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
## 2020-01-18 ENCOUNTER — Other Ambulatory Visit: Payer: Self-pay

## 2020-01-18 ENCOUNTER — Encounter (HOSPITAL_COMMUNITY): Payer: Self-pay | Admitting: Psychiatry

## 2020-01-18 ENCOUNTER — Ambulatory Visit (INDEPENDENT_AMBULATORY_CARE_PROVIDER_SITE_OTHER): Payer: Medicare Other | Admitting: Psychiatry

## 2020-01-18 DIAGNOSIS — F4310 Post-traumatic stress disorder, unspecified: Secondary | ICD-10-CM

## 2020-01-18 DIAGNOSIS — F330 Major depressive disorder, recurrent, mild: Secondary | ICD-10-CM

## 2020-01-18 MED ORDER — SERTRALINE HCL 100 MG PO TABS: 100.0000 mg | ORAL_TABLET | Freq: Every day | ORAL | 0 refills | Status: DC
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
## 2020-04-04 ENCOUNTER — Other Ambulatory Visit: Payer: Self-pay

## 2020-04-04 ENCOUNTER — Encounter (HOSPITAL_COMMUNITY): Payer: Self-pay | Admitting: Psychiatry

## 2020-04-04 ENCOUNTER — Telehealth (INDEPENDENT_AMBULATORY_CARE_PROVIDER_SITE_OTHER): Payer: Medicare Other | Admitting: Psychiatry

## 2020-04-04 DIAGNOSIS — F4310 Post-traumatic stress disorder, unspecified: Secondary | ICD-10-CM

## 2020-04-04 DIAGNOSIS — F330 Major depressive disorder, recurrent, mild: Secondary | ICD-10-CM | POA: Diagnosis not present

## 2020-04-04 DIAGNOSIS — F1021 Alcohol dependence, in remission: Secondary | ICD-10-CM

## 2020-04-04 MED ORDER — SERTRALINE HCL 100 MG PO TABS: 150.0000 mg | ORAL_TABLET | Freq: Every day | ORAL | 0 refills | Status: DC

## 2020-04-04 NOTE — Patient Instructions (Signed)
1. Increase sertraline 150 mg daily  2. Next appointment: 8/9 at 3:10
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
## 2020-05-29 ENCOUNTER — Other Ambulatory Visit: Payer: Self-pay

## 2020-05-29 ENCOUNTER — Telehealth (INDEPENDENT_AMBULATORY_CARE_PROVIDER_SITE_OTHER): Payer: Medicare Other | Admitting: Psychiatry

## 2020-05-29 ENCOUNTER — Encounter (HOSPITAL_COMMUNITY): Payer: Self-pay | Admitting: Psychiatry

## 2020-05-29 DIAGNOSIS — F1021 Alcohol dependence, in remission: Secondary | ICD-10-CM

## 2020-05-29 DIAGNOSIS — F330 Major depressive disorder, recurrent, mild: Secondary | ICD-10-CM | POA: Diagnosis not present

## 2020-05-29 DIAGNOSIS — F4310 Post-traumatic stress disorder, unspecified: Secondary | ICD-10-CM

## 2020-05-29 MED ORDER — VENLAFAXINE HCL ER 150 MG PO CP24: ORAL_CAPSULE | ORAL | 1 refills | Status: DC

## 2020-05-29 MED ORDER — VENLAFAXINE HCL ER 37.5 MG PO CP24: ORAL_CAPSULE | ORAL | 0 refills | Status: DC
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
## 2020-08-07 ENCOUNTER — Telehealth (HOSPITAL_COMMUNITY): Payer: Self-pay | Admitting: Psychiatry

## 2020-08-07 ENCOUNTER — Other Ambulatory Visit: Payer: Self-pay

## 2020-08-07 ENCOUNTER — Telehealth (HOSPITAL_COMMUNITY): Payer: Medicare Other | Admitting: Psychiatry

## 2020-08-07 NOTE — Telephone Encounter (Signed)
Sent link for video visit through Epic. Patient did not sign in. Called the patient  for appointment scheduled today. The patient did not answer the phone. Left voice message to contact the office.  

## 2020-11-20 ENCOUNTER — Encounter: Payer: Self-pay | Admitting: Internal Medicine

## 2020-11-20 ENCOUNTER — Telehealth: Payer: Self-pay | Admitting: Psychiatry

## 2020-11-20 ENCOUNTER — Ambulatory Visit (INDEPENDENT_AMBULATORY_CARE_PROVIDER_SITE_OTHER): Payer: Medicare Other | Admitting: Internal Medicine

## 2020-11-20 ENCOUNTER — Other Ambulatory Visit: Payer: Self-pay

## 2020-11-20 VITALS — BP 144/83 | HR 73 | Temp 98.7°F | Resp 18 | Ht 66.0 in | Wt 136.0 lb

## 2020-11-20 DIAGNOSIS — M5441 Lumbago with sciatica, right side: Secondary | ICD-10-CM

## 2020-11-20 DIAGNOSIS — N529 Male erectile dysfunction, unspecified: Secondary | ICD-10-CM

## 2020-11-20 DIAGNOSIS — G8929 Other chronic pain: Secondary | ICD-10-CM | POA: Diagnosis not present

## 2020-11-20 DIAGNOSIS — F419 Anxiety disorder, unspecified: Secondary | ICD-10-CM

## 2020-11-20 DIAGNOSIS — Z7689 Persons encountering health services in other specified circumstances: Secondary | ICD-10-CM | POA: Insufficient documentation

## 2020-11-20 DIAGNOSIS — M5442 Lumbago with sciatica, left side: Secondary | ICD-10-CM

## 2020-11-20 DIAGNOSIS — J449 Chronic obstructive pulmonary disease, unspecified: Secondary | ICD-10-CM

## 2020-11-20 DIAGNOSIS — Z720 Tobacco use: Secondary | ICD-10-CM | POA: Insufficient documentation

## 2020-11-20 DIAGNOSIS — F4310 Post-traumatic stress disorder, unspecified: Secondary | ICD-10-CM

## 2020-11-20 MED ORDER — ALBUTEROL SULFATE HFA 108 (90 BASE) MCG/ACT IN AERS: 2.0000 | INHALATION_SPRAY | Freq: Four times a day (QID) | RESPIRATORY_TRACT | 3 refills | Status: DC | PRN

## 2020-11-20 MED ORDER — GABAPENTIN 100 MG PO CAPS: 100.0000 mg | ORAL_CAPSULE | Freq: Three times a day (TID) | ORAL | 3 refills | Status: DC

## 2020-11-20 MED ORDER — BUDESONIDE-FORMOTEROL FUMARATE 160-4.5 MCG/ACT IN AERO: 2.0000 | INHALATION_SPRAY | Freq: Two times a day (BID) | RESPIRATORY_TRACT | 2 refills | Status: DC

## 2020-11-20 MED ORDER — SILDENAFIL CITRATE 50 MG PO TABS: 50.0000 mg | ORAL_TABLET | Freq: Every day | ORAL | 0 refills | Status: DC | PRN

## 2020-11-20 MED ORDER — KETOROLAC TROMETHAMINE 60 MG/2ML IM SOLN
60.0000 mg | Freq: Once | INTRAMUSCULAR | Status: AC
Start: 2020-11-20 — End: 2020-11-20
  Administered 2020-11-20: 60 mg via INTRAMUSCULAR

## 2020-11-20 NOTE — Assessment & Plan Note (Signed)
Previous X-ray lumbar spine reviewed Has lumbar pain with tingling in the feet Check X-ray lumbar spine Started Gabapentin Ketorolac injection in the office today

## 2020-11-20 NOTE — Assessment & Plan Note (Signed)
Was on Symbicort previously Restart Symbicort Albuterol PRN Continues to smoke, has been cutting down. Advised to quit smoking soon.

## 2020-11-20 NOTE — Progress Notes (Signed)
New Patient Office Visit  Subjective:  Patient ID: Keith Nolan, male    DOB: 05/03/1971  Age: 50 y.o. MRN: [id number]  CC:  Chief Complaint  Patient presents with  . New Patient (Initial Visit)    New patient does have tightening and stiffening of the joints feels like dr Luan Pulling cut him off of his pain medicines too soon     HPI Keith Nolan is a 50 year old male with PMH of asthma with COPD, chronic low back pain, chronic opioid medication use in the past, anxiety with PTSD and tobacco abuse who presents for establishing care.  He c/o chronic back pain, for which he used to take Norco in the past, prescribed by previous PCP - Dr Luan Pulling. He had to stop taking it about a year ago as his PCP retired. He wants to be placed back on Norco. Patient was counseled about long-term effects of chronic opioid use. He mentions that the pain is chronic, midline, radiating to both the legs associated with b/l LE tingling and worse with prolonged walking and bending. He denies any recent injury or lifting weight.  He has h/o asthma and COPD. He continues to smoke 7-8 cigarettes/day and has been trying to cut down. He was on Symbicort in the past, but he has not used it since he was using it as rescue inhaler and thought it was not helping.  He has h/o anxiety with PTSD and follows up with Psychiatrist. Has not been taking any medication. Chart review suggests use of dfifferent agents in the past - Trazodone, Zoloft, Effexor and Xanax.  He also c/o erectile dysfunction. Has morning erections at times. Has used Sildenafil in the past and had good response with it.  He has had 2 doses of COVID vaccine. Has not had flu vaccine yet.  Past Medical History:  Diagnosis Date  . COPD (chronic obstructive pulmonary disease) ([place])   . DJD (degenerative joint disease)   . Seasonal allergies   . SHOULDER DISLOCATION-RECURRENT 10/25/2010   Qualifier: Diagnosis of  By: Aline Brochure MD, Dorothyann Peng    . SUBLUXATION  PATELLAR (MALALIGNMENT) 10/25/2010   Qualifier: Diagnosis of  By: Aline Brochure MD, Dorothyann Peng      History reviewed. No pertinent surgical history.  Family History  Problem Relation Age of Onset  . Anxiety disorder Mother   . Depression Mother   . Anxiety disorder Father   . Depression Father   . Alcohol abuse Maternal Grandfather     Social History   Socioeconomic History  . Marital status: Single    Spouse name: Not on file  . Number of children: Not on file  . Years of education: Not on file  . Highest education level: Not on file  Occupational History  . Not on file  Tobacco Use  . Smoking status: Current Every Day Smoker    Packs/day: 0.50    Years: 30.00    Pack years: 15.00    Types: Cigarettes  . Smokeless tobacco: Never Used  . Tobacco comment: vaping  Vaping Use  . Vaping Use: Never used  Substance and Sexual Activity  . Alcohol use: No  . Drug use: No  . Sexual activity: Never  Other Topics Concern  . Not on file  Social History Narrative  . Not on file   Social Determinants of Health   Financial Resource Strain: Not on file  Food Insecurity: Not on file  Transportation Needs: Not on file  Physical Activity: Not on file  Stress: Not on file  Social Connections: Not on file  Intimate Partner Violence: Not on file    ROS Review of Systems  Constitutional: Negative for chills and fever.  HENT: Negative for congestion and sore throat.   Eyes: Negative for pain and discharge.  Respiratory: Positive for shortness of breath. Negative for cough.   Cardiovascular: Negative for chest pain and palpitations.  Gastrointestinal: Negative for constipation, diarrhea, nausea and vomiting.  Endocrine: Negative for polydipsia and polyuria.  Genitourinary: Negative for dysuria and hematuria.  Musculoskeletal: Positive for back pain and gait problem. Negative for neck pain and neck stiffness.  Skin: Negative for rash.  Neurological: Negative for dizziness, weakness,  numbness and headaches.  Psychiatric/Behavioral: Negative for agitation and behavioral problems.    Objective:   Today's Vitals: BP (!) 144/83 (BP Location: Right Arm, Patient Position: Sitting, Cuff Size: Normal)   Pulse 73   Temp 98.7 F (37.1 C) (Oral)   Resp 18   Ht 5' 6"  (1.676 m)   Wt 136 lb (61.7 kg)   SpO2 96%   BMI 21.95 kg/m   Physical Exam Vitals reviewed.  Constitutional:      General: He is not in acute distress.    Appearance: He is not diaphoretic.  HENT:     Head: Normocephalic and atraumatic.     Nose: Nose normal.     Mouth/Throat:     Mouth: Mucous membranes are moist.  Eyes:     General: No scleral icterus.    Extraocular Movements: Extraocular movements intact.     Pupils: Pupils are equal, round, and reactive to light.  Cardiovascular:     Rate and Rhythm: Normal rate and regular rhythm.     Pulses: Normal pulses.     Heart sounds: Normal heart sounds. No murmur heard.   Pulmonary:     Breath sounds: Normal breath sounds. No wheezing or rales.  Abdominal:     Palpations: Abdomen is soft.     Tenderness: There is no abdominal tenderness.  Musculoskeletal:        General: Tenderness (Lumbar spine area) present.     Cervical back: Neck supple. No tenderness.     Right lower leg: No edema.     Left lower leg: No edema.  Skin:    General: Skin is warm.     Findings: No rash.  Neurological:     General: No focal deficit present.     Mental Status: He is alert and oriented to person, place, and time.     Sensory: No sensory deficit.     Motor: No weakness.  Psychiatric:        Mood and Affect: Mood normal.        Behavior: Behavior normal.     Assessment & Plan:   Problem List Items Addressed This Visit       Encounter to establish care - Primary   Care established Previous chart reviewed History and medications reviewed with the patient      Relevant Orders  CBC with Differential  CMP14+EGFR  Hemoglobin A1c  Lipid panel   Vitamin D (25 hydroxy)  TSH   Respiratory   Asthma with COPD (chronic obstructive pulmonary disease) ([place])    Was on Symbicort previously Restart Symbicort Albuterol PRN Continues to smoke, has been cutting down. Advised to quit smoking soon.      Relevant Medications   budesonide-formoterol (SYMBICORT) 160-4.5 MCG/ACT inhaler   albuterol (VENTOLIN HFA) 108 (90 Base) MCG/ACT inhaler  Nervous and Auditory   Chronic low back pain with bilateral sciatica    Previous X-ray lumbar spine reviewed Has lumbar pain with tingling in the feet Check X-ray lumbar spine Started Gabapentin Ketorolac injection in the office today      Relevant Medications   gabapentin (NEURONTIN) 100 MG capsule   Other Relevant Orders   DG Lumbar Spine Complete     Other      Anxiety    Follows up with Psychiatrist Not taking any medications currently Has tried multiple medications in the past - Zoloft, Trazodone, Xanax      PTSD (post-traumatic stress disorder)    Was on Effexor according to the last evaluation by Psychiatrist Has not been taking any medication Advised to contact Psychiatrist to schedule appointment      Tobacco abuse    Smokes about 7-8 cigarettes/day, has been trying tto cut down.  Asked about quitting: confirms that he is currently smokes cigarettes Advise to quit smoking: Educated about QUITTING to reduce the risk of cancer, cardio and cerebrovascular disease. Assess willingness: Unwilling to quit at this time, but is working on cutting back. Assist with counseling and pharmacotherapy: Counseled for 5 minutes and literature provided. Arrange for follow up: follow up in 3 months and continue to offer help.      Erectile dysfunction    Occasional morning erections Could be related to anxiety Has tried Viagra in the past, requests a refill - provided.      Relevant Medications   sildenafil (VIAGRA) 50 MG tablet      Outpatient Encounter Medications as of  11/20/2020  Medication Sig  . albuterol (VENTOLIN HFA) 108 (90 Base) MCG/ACT inhaler Inhale 2 puffs into the lungs every 6 (six) hours as needed for wheezing or shortness of breath.  . gabapentin (NEURONTIN) 100 MG capsule Take 1 capsule (100 mg total) by mouth 3 (three) times daily.  . sildenafil (VIAGRA) 50 MG tablet Take 1 tablet (50 mg total) by mouth daily as needed for erectile dysfunction.  . budesonide-formoterol (SYMBICORT) 160-4.5 MCG/ACT inhaler Inhale 2 puffs into the lungs 2 (two) times daily.  Marland Kitchen HYDROcodone-acetaminophen (NORCO) 7.5-325 MG tablet Take 1 tablet by mouth 4 (four) times daily. (Patient not taking: Reported on 11/20/2020)  . traZODone (DESYREL) 100 MG tablet Take 100 mg by mouth at bedtime. (Patient not taking: Reported on 11/20/2020)  . venlafaxine XR (EFFEXOR-XR) 150 MG 24 hr capsule 150 mg daily. Start after completing 75 mg daily for one week (Patient not taking: Reported on 11/20/2020)  . venlafaxine XR (EFFEXOR-XR) 37.5 MG 24 hr capsule 37.5 mg daily for one week, then 75 mg daily (Patient not taking: Reported on 11/20/2020)  . [DISCONTINUED] budesonide-formoterol (SYMBICORT) 160-4.5 MCG/ACT inhaler Inhale 2 puffs into the lungs 2 (two) times daily. (Patient not taking: Reported on 11/20/2020)  . [DISCONTINUED] cephALEXin (KEFLEX) 500 MG capsule Take 1 capsule (500 mg total) by mouth 4 (four) times daily. (Patient not taking: Reported on 11/20/2020)  . [EXPIRED] ketorolac (TORADOL) injection 60 mg    No facility-administered encounter medications on file as of 11/20/2020.    Follow-up: Return in about 3 months (around 02/17/2021) for Blood pressure, anxiety and back pain.   Lindell Spar, MD

## 2020-11-20 NOTE — Assessment & Plan Note (Signed)
Smokes about 7-8 cigarettes/day, has been trying tto cut down.  Asked about quitting: confirms that he is currently smokes cigarettes Advise to quit smoking: Educated about QUITTING to reduce the risk of cancer, cardio and cerebrovascular disease. Assess willingness: Unwilling to quit at this time, but is working on cutting back. Assist with counseling and pharmacotherapy: Counseled for 5 minutes and literature provided. Arrange for follow up: follow up in 3 months and continue to offer help.

## 2020-11-20 NOTE — Assessment & Plan Note (Signed)
Care established Previous chart reviewed History and medications reviewed with the patient 

## 2020-11-20 NOTE — Assessment & Plan Note (Signed)
Follows up with Psychiatrist Not taking any medications currently Has tried multiple medications in the past - Zoloft, Trazodone, Xanax

## 2020-11-20 NOTE — Patient Instructions (Signed)
Please start using Symbicort regularly. Use Albuterol only for shortness of breath.  Please start taking Gabapentin as prescribed.  Please follow low sodium diet. Please cut down smoking.  Please get X-ray of the lumbar spine done.  Please contact your Psychiatrist office to schedule appointment.

## 2020-11-20 NOTE — Assessment & Plan Note (Signed)
Was on Effexor according to the last evaluation by Psychiatrist Has not been taking any medication Advised to contact Psychiatrist to schedule appointment

## 2020-11-20 NOTE — Assessment & Plan Note (Signed)
Occasional morning erections Could be related to anxiety Has tried Viagra in the past, requests a refill - provided.

## 2020-11-20 NOTE — Telephone Encounter (Signed)
According to the staff, the patient reportedly contact the office to speak with this provider. Could you contact this patient to address the issue? I have not seen him since last August. Please advise him to have follow up (for 30 mins) as needed.

## 2020-11-21 ENCOUNTER — Encounter: Payer: Self-pay | Admitting: Psychiatry

## 2020-11-21 ENCOUNTER — Telehealth (INDEPENDENT_AMBULATORY_CARE_PROVIDER_SITE_OTHER): Payer: Medicare Other | Admitting: Psychiatry

## 2020-11-21 DIAGNOSIS — F4310 Post-traumatic stress disorder, unspecified: Secondary | ICD-10-CM

## 2020-11-21 DIAGNOSIS — F330 Major depressive disorder, recurrent, mild: Secondary | ICD-10-CM

## 2020-11-21 DIAGNOSIS — F1021 Alcohol dependence, in remission: Secondary | ICD-10-CM | POA: Diagnosis not present

## 2020-11-21 MED ORDER — VENLAFAXINE HCL ER 75 MG PO CP24: 75.0000 mg | ORAL_CAPSULE | Freq: Every day | ORAL | 0 refills | Status: DC

## 2020-11-21 MED ORDER — VENLAFAXINE HCL ER 150 MG PO CP24: ORAL_CAPSULE | ORAL | 1 refills | Status: DC
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
## 2020-11-21 NOTE — Telephone Encounter (Signed)
Pt made an appt  °

## 2020-11-23 ENCOUNTER — Other Ambulatory Visit: Payer: Self-pay | Admitting: *Deleted

## 2020-11-23 ENCOUNTER — Telehealth: Payer: Self-pay

## 2020-11-23 DIAGNOSIS — N529 Male erectile dysfunction, unspecified: Secondary | ICD-10-CM

## 2020-11-23 MED ORDER — SILDENAFIL CITRATE 50 MG PO TABS: 50.0000 mg | ORAL_TABLET | Freq: Every day | ORAL | 0 refills | Status: DC | PRN

## 2020-11-23 NOTE — Telephone Encounter (Signed)
Medication sent to pharmacy pt LVM for pt to let him know

## 2020-11-23 NOTE — Telephone Encounter (Signed)
Patient needing script for Viagra resent to [facility] [address] they never received it can we call him when this is sent p# [phone]

## 2020-11-27 ENCOUNTER — Telehealth: Payer: Self-pay | Admitting: *Deleted

## 2020-11-27 NOTE — Telephone Encounter (Signed)
Pt is calling wanting to know if you were going to send in any medication for his bp. Please advise

## 2020-11-27 NOTE — Telephone Encounter (Signed)
Please advise him to follow low salt diet. I had discussed that we would start a medicine for BP if it is persistently elevated in the next visit.

## 2020-11-28 ENCOUNTER — Telehealth (INDEPENDENT_AMBULATORY_CARE_PROVIDER_SITE_OTHER): Payer: Medicare Other | Admitting: Internal Medicine

## 2020-11-28 ENCOUNTER — Encounter: Payer: Self-pay | Admitting: Internal Medicine

## 2020-11-28 ENCOUNTER — Other Ambulatory Visit: Payer: Self-pay

## 2020-11-28 DIAGNOSIS — M1710 Unilateral primary osteoarthritis, unspecified knee: Secondary | ICD-10-CM | POA: Diagnosis not present

## 2020-11-28 MED ORDER — CYCLOBENZAPRINE HCL 10 MG PO TABS
10.0000 mg | ORAL_TABLET | Freq: Three times a day (TID) | ORAL | 0 refills | Status: DC | PRN
Start: 2020-11-28 — End: 2021-02-28

## 2020-11-28 MED ORDER — DICLOFENAC SODIUM 1 % EX GEL: 2.0000 g | Freq: Four times a day (QID) | CUTANEOUS | 2 refills | Status: DC

## 2020-11-28 NOTE — Telephone Encounter (Signed)
LVM for pt to call the office.

## 2020-11-28 NOTE — Telephone Encounter (Signed)
Pt advised of recommendation with verbal understanding  

## 2020-11-28 NOTE — Patient Instructions (Addendum)
Please take Flexeril as prescribed for muscle spasms.  Please take Tylenol as needed for knee pain up to 3 times in a day.  Please perform simple knee exercises.  Knee Exercises Ask your health care provider which exercises are safe for you. Do exercises exactly as told by your health care provider and adjust them as directed. It is normal to feel mild stretching, pulling, tightness, or discomfort as you do these exercises. Stop right away if you feel sudden pain or your pain gets worse. Do not begin these exercises until told by your health care provider. Stretching and range-of-motion exercises These exercises warm up your muscles and joints and improve the movement and flexibility of your knee. These exercises also help to relieve pain and swelling. Knee extension, prone 1. Lie on your abdomen (prone position) on a bed. 2. Place your left / right knee just beyond the edge of the surface so your knee is not on the bed. You can put a towel under your left / right thigh just above your kneecap for comfort. 3. Relax your leg muscles and allow gravity to straighten your knee (extension). You should feel a stretch behind your left / right knee. 4. Hold this position for ___5_______ seconds. 5. Scoot up so your knee is supported between repetitions. Repeat __3________ times. Complete this exercise _____2_____ times a day. Knee flexion, active 1. Lie on your back with both legs straight. If this causes back discomfort, bend your left / right knee so your foot is flat on the floor. 2. Slowly slide your left / right heel back toward your buttocks. Stop when you feel a gentle stretch in the front of your knee or thigh (flexion). 3. Hold this position for ____5______ seconds. 4. Slowly slide your left / right heel back to the starting position. Repeat ___3_______ times. Complete this exercise ____2______ times a day.   Quadriceps stretch, prone 1. Lie on your abdomen on a firm surface, such as a bed  or padded floor. 2. Bend your left / right knee and hold your ankle. If you cannot reach your ankle or pant leg, loop a belt around your foot and grab the belt instead. 3. Gently pull your heel toward your buttocks. Your knee should not slide out to the side. You should feel a stretch in the front of your thigh and knee (quadriceps). 4. Hold this position for ___3_______ seconds. Repeat ___3_______ times. Complete this exercise ___2_______ times a day.   Hamstring, supine 1. Lie on your back (supine position). 2. Loop a belt or towel over the ball of your left / right foot. The ball of your foot is on the walking surface, right under your toes. 3. Straighten your left / right knee and slowly pull on the belt to raise your leg until you feel a gentle stretch behind your knee (hamstring). ? Do not let your knee bend while you do this. ? Keep your other leg flat on the floor. 4. Hold this position for ____3______ seconds. Repeat ___3_______ times. Complete this exercise ___2_______ times a day. Strengthening exercises These exercises build strength and endurance in your knee. Endurance is the ability to use your muscles for a long time, even after they get tired. Quadriceps, isometric This exercise stretches the muscles in front of your thigh (quadriceps) without moving your knee joint (isometric). 1. Lie on your back with your left / right leg extended and your other knee bent. Put a rolled towel or small pillow under your  knee if told by your health care provider. 2. Slowly tense the muscles in the front of your left / right thigh. You should see your kneecap slide up toward your hip or see increased dimpling just above the knee. This motion will push the back of the knee toward the floor. 3. For ___3_______ seconds, hold the muscle as tight as you can without increasing your pain. 4. Relax the muscles slowly and completely. Repeat ___3_______ times. Complete this exercise ___2_______ times a  day.   Straight leg raises This exercise stretches the muscles in front of your thigh (quadriceps) and the muscles that move your hips (hip flexors). 1. Lie on your back with your left / right leg extended and your other knee bent. 2. Tense the muscles in the front of your left / right thigh. You should see your kneecap slide up or see increased dimpling just above the knee. Your thigh may even shake a bit. 3. Keep these muscles tight as you raise your leg 4-6 inches (10-15 cm) off the floor. Do not let your knee bend. 4. Hold this position for ___5_______ seconds. 5. Keep these muscles tense as you lower your leg. 6. Relax your muscles slowly and completely after each repetition. Repeat ____5______ times. Complete this exercise ___2_______ times a day. Hamstring, isometric 1. Lie on your back on a firm surface. 2. Bend your left / right knee about __________ degrees. 3. Dig your left / right heel into the surface as if you are trying to pull it toward your buttocks. Tighten the muscles in the back of your thighs (hamstring) to "dig" as hard as you can without increasing any pain. 4. Hold this position for ___3_______ seconds. 5. Release the tension gradually and allow your muscles to relax completely for ___5_______ seconds after each repetition. Repeat ___3_______ times. Complete this exercise __________ times a day.   Squats This exercise strengthens the muscles in front of your thigh and knee (quadriceps). 1. Stand in front of a table, with your feet and knees pointing straight ahead. You may rest your hands on the table for balance but not for support. 2. Slowly bend your knees and lower your hips like you are going to sit in a chair. ? Keep your weight over your heels, not over your toes. ? Keep your lower legs upright so they are parallel with the table legs. ? Do not let your hips go lower than your knees. ? Do not bend lower than told by your health care provider. ? If your knee  pain increases, do not bend as low. 3. Hold the squat position for ____3______ seconds. 4. Slowly push with your legs to return to standing. Do not use your hands to pull yourself to standing. Repeat ___3_______ times. Complete this exercise ___2_______ times a day. Straight leg raises This exercise strengthens the muscles that rotate the leg at the hip and move it away from your body (hip abductors). 1. Lie on your side with your left / right leg in the top position. Lie so your head, shoulder, knee, and hip line up. You may bend your bottom knee to help you keep your balance. 2. Roll your hips slightly forward so your hips are stacked directly over each other and your left / right knee is facing forward. 3. Leading with your heel, lift your top leg 4-6 inches (10-15 cm). You should feel the muscles in your outer hip lifting. ? Do not let your foot drift forward. ? Do  not let your knee roll toward the ceiling. 4. Hold this position for ____5______ seconds. 5. Slowly return your leg to the starting position. 6. Let your muscles relax completely after each repetition. Repeat _____3_____ times. Complete this exercise ____2______ times a day.   Straight leg raises This exercise stretches the muscles that move your hips away from the front of the pelvis (hip extensors). 1. Lie on your abdomen on a firm surface. You can put a pillow under your hips if that is more comfortable. 2. Tense the muscles in your buttocks and lift your left / right leg about 4-6 inches (10-15 cm). Keep your knee straight as you lift your leg. 3. Hold this position for ___5_______ seconds. 4. Slowly lower your leg to the starting position. 5. Let your leg relax completely after each repetition. Repeat ____3______ times. Complete this exercise ____2______ times a day. This information is not intended to replace advice given to you by your health care provider. Make sure you discuss any questions you have with your health  care provider. Document Revised: 07/28/2018 Document Reviewed: 07/28/2018 Elsevier Patient Education  2021 [employer].

## 2020-11-28 NOTE — Progress Notes (Signed)
Virtual Visit via Telephone Note   This visit type was conducted due to national recommendations for restrictions regarding the COVID-19 Pandemic (e.g. social distancing) in an effort to limit this patient's exposure and mitigate transmission in our community.  Due to his co-morbid illnesses, this patient is at least at moderate risk for complications without adequate follow up.  This format is felt to be most appropriate for this patient at this time.  The patient did not have access to video technology/had technical difficulties with video requiring transitioning to audio format only (telephone).  All issues noted in this document were discussed and addressed.  No physical exam could be performed with this format.  Evaluation Performed:  Follow-up visit  Date:  11/28/2020   ID:  Keith Nolan, DOB [birth year], MRN [id number]  Patient Location: Home Provider Location: Office/Clinic  Participants: Patient Location of Patient: Home Location of Provider: Telehealth Consent was obtain for visit to be over via telehealth. I verified that I am speaking with the correct person using two identifiers.  PCP:  Lindell Spar, MD   Chief Complaint:  Knee pain  History of Present Illness:    Keith Nolan is a 50 y.o. male who has a televisit for c/o b/l knee pain. He has a h/o knee arthritis. He states that he used to take Flexeril and other pain medications for knee arthritis. He has been having difficulty moving his knees without any medication. Denies any recent injury. Denies any numbness, weakness or tingling of the feet.  The patient does not have symptoms concerning for COVID-19 infection (fever, chills, cough, or new shortness of breath).   Past Medical, Surgical, Social History, Allergies, and Medications have been Reviewed.  Past Medical History:  Diagnosis Date  . COPD (chronic obstructive pulmonary disease) ([place])   . DJD (degenerative joint disease)   . Seasonal allergies    . SHOULDER DISLOCATION-RECURRENT 10/25/2010   Qualifier: Diagnosis of  By: Aline Brochure MD, Dorothyann Peng    . SUBLUXATION PATELLAR (MALALIGNMENT) 10/25/2010   Qualifier: Diagnosis of  By: Aline Brochure MD, Dorothyann Peng     History reviewed. No pertinent surgical history.   Current Meds  Medication Sig  . albuterol (VENTOLIN HFA) 108 (90 Base) MCG/ACT inhaler Inhale 2 puffs into the lungs every 6 (six) hours as needed for wheezing or shortness of breath.  . budesonide-formoterol (SYMBICORT) 160-4.5 MCG/ACT inhaler Inhale 2 puffs into the lungs 2 (two) times daily.  . cyclobenzaprine (FLEXERIL) 10 MG tablet Take 1 tablet (10 mg total) by mouth 3 (three) times daily as needed for muscle spasms.  . diclofenac Sodium (VOLTAREN) 1 % GEL Apply 2 g topically 4 (four) times daily.  Marland Kitchen gabapentin (NEURONTIN) 100 MG capsule Take 1 capsule (100 mg total) by mouth 3 (three) times daily.  Marland Kitchen HYDROcodone-acetaminophen (NORCO) 7.5-325 MG tablet Take 1 tablet by mouth 4 (four) times daily.  . sildenafil (VIAGRA) 50 MG tablet Take 1 tablet (50 mg total) by mouth daily as needed for erectile dysfunction.  Marland Kitchen venlafaxine XR (EFFEXOR-XR) 150 MG 24 hr capsule 150 mg daily. Start after completing 75 mg daily for one week  . venlafaxine XR (EFFEXOR-XR) 75 MG 24 hr capsule Take 1 capsule (75 mg total) by mouth daily with breakfast.     Allergies:   Other   ROS:   Please see the history of present illness.     All other systems reviewed and are negative.   Labs/Other Tests and Data Reviewed:  Recent Labs: No results found for requested labs within last 8760 hours.   Recent Lipid Panel Lab Results  Component Value Date/Time   CHOL 108 07/10/2018 12:00 AM   TRIG 63 07/10/2018 12:00 AM   HDL 35 07/10/2018 12:00 AM   LDLCALC 59 07/10/2018 12:00 AM    Wt Readings from Last 3 Encounters:  11/20/20 136 lb (61.7 kg)  12/16/19 123 lb (55.8 kg)  11/23/19 132 lb (59.9 kg)      ASSESSMENT & PLAN:    Knee arthritis Knee  exercises advised, material provided Tyelnol PRN Voltaren gel PRN Flexeril for muscle spasms Continue Gabapentin for chronic back pain  Time:   Today, I have spent 15 minutes reviewing the chart, including problem list, medications, and with the patient with telehealth technology discussing the above problems.   Medication Adjustments/Labs and Tests Ordered: Current medicines are reviewed at length with the patient today.  Concerns regarding medicines are outlined above.   Tests Ordered: No orders of the defined types were placed in this encounter.   Medication Changes: Meds ordered this encounter  Medications  . cyclobenzaprine (FLEXERIL) 10 MG tablet    Sig: Take 1 tablet (10 mg total) by mouth 3 (three) times daily as needed for muscle spasms.    Dispense:  30 tablet    Refill:  0  . diclofenac Sodium (VOLTAREN) 1 % GEL    Sig: Apply 2 g topically 4 (four) times daily.    Dispense:  50 g    Refill:  2     Note: This dictation was prepared with Dragon dictation along with smaller phrase technology. Similar sounding words can be transcribed inadequately or may not be corrected upon review. Any transcriptional errors that result from this process are unintentional.      Disposition:  Follow up  Signed, Lindell Spar, MD  11/28/2020 10:32 AM     [place]
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
## 2021-01-02 ENCOUNTER — Other Ambulatory Visit: Payer: Self-pay

## 2021-01-02 ENCOUNTER — Encounter: Payer: Self-pay | Admitting: Psychiatry

## 2021-01-02 ENCOUNTER — Telehealth (INDEPENDENT_AMBULATORY_CARE_PROVIDER_SITE_OTHER): Payer: Medicare Other | Admitting: Psychiatry

## 2021-01-02 DIAGNOSIS — F4310 Post-traumatic stress disorder, unspecified: Secondary | ICD-10-CM | POA: Diagnosis not present

## 2021-01-02 DIAGNOSIS — F1021 Alcohol dependence, in remission: Secondary | ICD-10-CM

## 2021-01-02 DIAGNOSIS — F330 Major depressive disorder, recurrent, mild: Secondary | ICD-10-CM

## 2021-01-02 NOTE — Patient Instructions (Signed)
1. Continue venlafaxine 150 mg daily  2. Next appointment: 4/11 at 4 PM

## 2021-01-03 LAB — HM DIABETES EYE EXAM

## 2021-01-10 ENCOUNTER — Other Ambulatory Visit: Payer: Self-pay

## 2021-01-10 ENCOUNTER — Encounter (HOSPITAL_COMMUNITY): Payer: Self-pay | Admitting: Psychiatry

## 2021-01-10 ENCOUNTER — Ambulatory Visit (INDEPENDENT_AMBULATORY_CARE_PROVIDER_SITE_OTHER): Payer: Medicare Other | Admitting: Psychiatry

## 2021-01-10 DIAGNOSIS — F330 Major depressive disorder, recurrent, mild: Secondary | ICD-10-CM

## 2021-01-10 DIAGNOSIS — F4310 Post-traumatic stress disorder, unspecified: Secondary | ICD-10-CM | POA: Diagnosis not present

## 2021-01-11 ENCOUNTER — Encounter (HOSPITAL_COMMUNITY): Payer: Self-pay | Admitting: Psychiatry
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
## 2021-01-29 ENCOUNTER — Other Ambulatory Visit: Payer: Self-pay

## 2021-01-29 ENCOUNTER — Encounter: Payer: Self-pay | Admitting: Psychiatry

## 2021-01-29 ENCOUNTER — Telehealth (INDEPENDENT_AMBULATORY_CARE_PROVIDER_SITE_OTHER): Payer: Medicare Other | Admitting: Psychiatry

## 2021-01-29 DIAGNOSIS — F330 Major depressive disorder, recurrent, mild: Secondary | ICD-10-CM

## 2021-01-29 DIAGNOSIS — F4310 Post-traumatic stress disorder, unspecified: Secondary | ICD-10-CM | POA: Diagnosis not present

## 2021-01-29 MED ORDER — VENLAFAXINE HCL ER 75 MG PO CP24: ORAL_CAPSULE | ORAL | 1 refills | Status: DC

## 2021-01-29 MED ORDER — VENLAFAXINE HCL ER 150 MG PO CP24: ORAL_CAPSULE | ORAL | 1 refills | Status: DC

## 2021-02-16 ENCOUNTER — Ambulatory Visit (INDEPENDENT_AMBULATORY_CARE_PROVIDER_SITE_OTHER): Payer: Medicare Other | Admitting: Internal Medicine

## 2021-02-16 ENCOUNTER — Encounter: Payer: Self-pay | Admitting: Internal Medicine

## 2021-02-16 ENCOUNTER — Other Ambulatory Visit: Payer: Self-pay

## 2021-02-16 VITALS — BP 134/78 | HR 78 | Resp 18 | Ht 66.0 in | Wt 139.4 lb

## 2021-02-16 DIAGNOSIS — F4310 Post-traumatic stress disorder, unspecified: Secondary | ICD-10-CM

## 2021-02-16 DIAGNOSIS — F17210 Nicotine dependence, cigarettes, uncomplicated: Secondary | ICD-10-CM

## 2021-02-16 DIAGNOSIS — G8929 Other chronic pain: Secondary | ICD-10-CM

## 2021-02-16 DIAGNOSIS — F419 Anxiety disorder, unspecified: Secondary | ICD-10-CM

## 2021-02-16 DIAGNOSIS — M5441 Lumbago with sciatica, right side: Secondary | ICD-10-CM | POA: Diagnosis not present

## 2021-02-16 DIAGNOSIS — Z1211 Encounter for screening for malignant neoplasm of colon: Secondary | ICD-10-CM

## 2021-02-16 DIAGNOSIS — Z720 Tobacco use: Secondary | ICD-10-CM

## 2021-02-16 DIAGNOSIS — J449 Chronic obstructive pulmonary disease, unspecified: Secondary | ICD-10-CM | POA: Diagnosis not present

## 2021-02-16 DIAGNOSIS — M5442 Lumbago with sciatica, left side: Secondary | ICD-10-CM

## 2021-02-16 DIAGNOSIS — N529 Male erectile dysfunction, unspecified: Secondary | ICD-10-CM

## 2021-02-16 MED ORDER — BREO ELLIPTA 100-25 MCG/INH IN AEPB: 1.0000 | INHALATION_SPRAY | Freq: Every day | RESPIRATORY_TRACT | 2 refills | Status: DC

## 2021-02-16 MED ORDER — GABAPENTIN 100 MG PO CAPS: 100.0000 mg | ORAL_CAPSULE | Freq: Three times a day (TID) | ORAL | 3 refills | Status: DC

## 2021-02-16 MED ORDER — SILDENAFIL CITRATE 50 MG PO TABS
50.0000 mg | ORAL_TABLET | Freq: Every day | ORAL | 2 refills | Status: DC | PRN
Start: 2021-02-16 — End: 2021-07-26

## 2021-02-16 NOTE — Assessment & Plan Note (Signed)
Smokes about 3-4 cigarettes/day, has been trying to cut down.  Asked about quitting: confirms that he is currently smokes cigarettes Advise to quit smoking: Educated about QUITTING to reduce the risk of cancer, cardio and cerebrovascular disease. Assess willingness: Unwilling to quit at this time, but is working on cutting back. Assist with counseling and pharmacotherapy: Counseled for 5 minutes and literature provided. Arrange for follow up: follow up in 3 months and continue to offer help.

## 2021-02-16 NOTE — Patient Instructions (Signed)
Please continue taking medications as prescribed.  Please follow low salt diet and perform moderate exercise/walking at least 150 mins/week.  Please follow up with Psychiatry for anxiety medications.

## 2021-02-16 NOTE — Assessment & Plan Note (Signed)
Was on Symbicort previously, not covered now Switched to Breo Albuterol PRN Continues to smoke, has been cutting down. Advised to quit smoking soon.

## 2021-02-16 NOTE — Assessment & Plan Note (Signed)
Follows up with Psychiatrist Not taking any medications currently - was given Effexor by Psychiatry Has tried multiple medications in the past - Zoloft, Trazodone, Xanax

## 2021-02-16 NOTE — Assessment & Plan Note (Signed)
Was on Effexor according to the last evaluation by Psychiatrist Has not been taking any medication Advised to contact Psychiatrist to schedule appointment

## 2021-02-16 NOTE — Progress Notes (Signed)
Established Patient Office Visit  Subjective:  Patient ID: Keith Nolan, male    DOB: [birth year]  Age: 50 y.o. MRN: [id number]  CC:  Chief Complaint  Patient presents with  . Follow-up    3 month bp and anxiety check anxiety is the same back pain is the same didn't get xrays done also his ins wont cover viagra and he wants to know is there something else he can do     HPI Keith Nolan is a 50 year old male with PMH of asthma with COPD, chronic low back pain, chronic opioid medication use in the past, anxiety with PTSD and tobacco abuse who presents for follow up of his chronic medical conditions.  COPD: He has been using his Albuterol. He has run out of his Symbicort and he was told that it is not covered by his insurance. Of note, he had been using Symbicort for a long time. He denies any dyspnea or wheezing currently, but he has been using Albuterol more frequently.  Chronic low back pain: He was not able to continue Gabapentin as he was told from Pharmacy that he does not refills.  Anxiety with PTSD: Still has persistent anxiety, followed by Psychiatry. Has not been taking Effexor as he thinks that it is not working. Denies anhedonia, SI or HI.  Past Medical History:  Diagnosis Date  . COPD (chronic obstructive pulmonary disease) ([place])   . DJD (degenerative joint disease)   . Seasonal allergies   . SHOULDER DISLOCATION-RECURRENT 10/25/2010   Qualifier: Diagnosis of  By: Aline Brochure MD, Dorothyann Peng    . SUBLUXATION PATELLAR (MALALIGNMENT) 10/25/2010   Qualifier: Diagnosis of  By: Aline Brochure MD, Dorothyann Peng      History reviewed. No pertinent surgical history.  Family History  Problem Relation Age of Onset  . Anxiety disorder Mother   . Depression Mother   . Anxiety disorder Father   . Depression Father   . Alcohol abuse Father   . Alcohol abuse Maternal Grandfather     Social History   Socioeconomic History  . Marital status: Single    Spouse name: Not on file  . Number of  children: Not on file  . Years of education: Not on file  . Highest education level: Not on file  Occupational History  . Not on file  Tobacco Use  . Smoking status: Current Every Day Smoker    Packs/day: 1.00    Years: 30.00    Pack years: 30.00    Types: Cigarettes  . Smokeless tobacco: Never Used  . Tobacco comment: provided pt with [place] Quit line information  Vaping Use  . Vaping Use: Never used  Substance and Sexual Activity  . Alcohol use: No  . Drug use: No  . Sexual activity: Never  Other Topics Concern  . Not on file  Social History Narrative  . Not on file   Social Determinants of Health   Financial Resource Strain: Not on file  Food Insecurity: Not on file  Transportation Needs: Not on file  Physical Activity: Not on file  Stress: Not on file  Social Connections: Not on file  Intimate Partner Violence: Not on file    Outpatient Medications Prior to Visit  Medication Sig Dispense Refill  . albuterol (VENTOLIN HFA) 108 (90 Base) MCG/ACT inhaler Inhale 2 puffs into the lungs every 6 (six) hours as needed for wheezing or shortness of breath. 8 g 3  . budesonide-formoterol (SYMBICORT) 160-4.5 MCG/ACT inhaler Inhale 2 puffs into the  lungs 2 (two) times daily. 1 each 2  . cyclobenzaprine (FLEXERIL) 10 MG tablet Take 1 tablet (10 mg total) by mouth 3 (three) times daily as needed for muscle spasms. (Patient not taking: Reported on 02/16/2021) 30 tablet 0  . venlafaxine XR (EFFEXOR-XR) 150 MG 24 hr capsule 225 mg daily. Take along with 75 mg cap (Patient not taking: Reported on 02/16/2021) 30 capsule 1  . venlafaxine XR (EFFEXOR-XR) 75 MG 24 hr capsule 225 mg daily. Take along with 150 mg cap (Patient not taking: Reported on 02/16/2021) 30 capsule 1  . diclofenac Sodium (VOLTAREN) 1 % GEL Apply 2 g topically 4 (four) times daily. (Patient not taking: Reported on 02/16/2021) 50 g 2  . gabapentin (NEURONTIN) 100 MG capsule Take 1 capsule (100 mg total) by mouth 3 (three) times  daily. (Patient not taking: Reported on 02/16/2021) 90 capsule 3  . HYDROcodone-acetaminophen (NORCO) 7.5-325 MG tablet Take 1 tablet by mouth 4 (four) times daily. (Patient not taking: Reported on 02/16/2021)    . sildenafil (VIAGRA) 50 MG tablet Take 1 tablet (50 mg total) by mouth daily as needed for erectile dysfunction. (Patient not taking: Reported on 02/16/2021) 10 tablet 0   No facility-administered medications prior to visit.    Allergies  Allergen Reactions  . Other Hives, Itching, Rash and Swelling    ROS Review of Systems  Constitutional: Negative for chills and fever.  HENT: Negative for congestion and sore throat.   Eyes: Negative for pain and discharge.  Respiratory: Positive for shortness of breath. Negative for cough.   Cardiovascular: Negative for chest pain and palpitations.  Gastrointestinal: Negative for constipation, diarrhea, nausea and vomiting.  Endocrine: Negative for polydipsia and polyuria.  Genitourinary: Negative for dysuria and hematuria.  Musculoskeletal: Positive for back pain. Negative for neck pain and neck stiffness.  Skin: Negative for rash.  Neurological: Negative for dizziness, weakness, numbness and headaches.  Psychiatric/Behavioral: Negative for agitation and behavioral problems.      Objective:    Physical Exam Vitals reviewed.  Constitutional:      General: He is not in acute distress.    Appearance: He is not diaphoretic.  HENT:     Head: Normocephalic and atraumatic.     Nose: Nose normal.     Mouth/Throat:     Mouth: Mucous membranes are moist.  Eyes:     General: No scleral icterus.    Extraocular Movements: Extraocular movements intact.     Pupils: Pupils are equal, round, and reactive to light.  Cardiovascular:     Rate and Rhythm: Normal rate and regular rhythm.     Pulses: Normal pulses.     Heart sounds: Normal heart sounds. No murmur heard.   Pulmonary:     Breath sounds: Normal breath sounds. No wheezing or rales.   Abdominal:     Palpations: Abdomen is soft.     Tenderness: There is no abdominal tenderness.  Musculoskeletal:        General: Tenderness (Lumbar spine area) present.     Cervical back: Neck supple. No tenderness.     Right lower leg: No edema.     Left lower leg: No edema.  Skin:    General: Skin is warm.     Findings: No rash.  Neurological:     General: No focal deficit present.     Mental Status: He is alert and oriented to person, place, and time.     Sensory: No sensory deficit.  Motor: No weakness.  Psychiatric:        Mood and Affect: Mood normal.        Behavior: Behavior normal.     BP 134/78 (BP Location: Right Arm, Patient Position: Sitting)   Pulse 78   Resp 18   Ht 5\' 6"  (1.676 m)   Wt 139 lb 6.4 oz (63.2 kg)   SpO2 98%   BMI 22.50 kg/m  Wt Readings from Last 3 Encounters:  02/16/21 139 lb 6.4 oz (63.2 kg)  11/20/20 136 lb (61.7 kg)  12/16/19 123 lb (55.8 kg)     Health Maintenance Due  Topic Date Due  . Hepatitis C Screening  Never done  . HIV Screening  Never done  . TETANUS/TDAP  Never done  . COLONOSCOPY (Pts 45-19yrs Insurance coverage will need to be confirmed)  Never done    There are no preventive care reminders to display for this patient.  Lab Results  Component Value Date   TSH 2.956 11/06/2018   Lab Results  Component Value Date   WBC 14.0 (H) 11/06/2018   HGB 14.7 11/06/2018   HCT 45.6 11/06/2018   MCV 93.8 11/06/2018   PLT 295 11/06/2018   Lab Results  Component Value Date   NA 138 11/06/2018   K 3.8 11/06/2018   CO2 26 11/06/2018   GLUCOSE 93 11/06/2018   BUN 15 11/06/2018   CREATININE 0.90 11/06/2018   ALKPHOS 71 07/10/2018   AST 14 07/10/2018   ALT 9 (A) 07/10/2018   CALCIUM 9.1 11/06/2018   ANIONGAP 8 11/06/2018   Lab Results  Component Value Date   CHOL 108 07/10/2018   Lab Results  Component Value Date   HDL 35 07/10/2018   Lab Results  Component Value Date   LDLCALC 59 07/10/2018   Lab  Results  Component Value Date   TRIG 63 07/10/2018   No results found for: CHOLHDL Lab Results  Component Value Date   HGBA1C 5.4 11/16/2018      Assessment & Plan:   Problem List Items Addressed This Visit      Respiratory   Asthma with COPD (chronic obstructive pulmonary disease) ([place]) - Primary    Was on Symbicort previously, not covered now Switched to Breo Albuterol PRN Continues to smoke, has been cutting down. Advised to quit smoking soon.      Relevant Medications   fluticasone furoate-vilanterol (BREO ELLIPTA) 100-25 MCG/INH AEPB     Nervous and Auditory   Chronic low back pain with bilateral sciatica    Previous X-ray lumbar spine reviewed Refilled Gabapentin      Relevant Medications   gabapentin (NEURONTIN) 100 MG capsule     Other   Anxiety    Follows up with Psychiatrist Not taking any medications currently - was given Effexor by Psychiatry Has tried multiple medications in the past - Zoloft, Trazodone, Xanax      PTSD (post-traumatic stress disorder)    Was on Effexor according to the last evaluation by Psychiatrist Has not been taking any medication Advised to contact Psychiatrist to schedule appointment      Tobacco abuse    Smokes about 3-4 cigarettes/day, has been trying to cut down.  Asked about quitting: confirms that he is currently smokes cigarettes Advise to quit smoking: Educated about QUITTING to reduce the risk of cancer, cardio and cerebrovascular disease. Assess willingness: Unwilling to quit at this time, but is working on cutting back. Assist with counseling and pharmacotherapy: Counseled for  5 minutes and literature provided. Arrange for follow up: follow up in 3 months and continue to offer help.      Erectile dysfunction    Occasional morning erections Could be related to anxiety Has tried Viagra in the past, was not able to get it due to insurance denial - he is going to try with coupon.      Relevant Medications    sildenafil (VIAGRA) 50 MG tablet    Other Visit Diagnoses    Screening for colon cancer       Relevant Orders   Ambulatory referral to Gastroenterology      Meds ordered this encounter  Medications  . gabapentin (NEURONTIN) 100 MG capsule    Sig: Take 1 capsule (100 mg total) by mouth 3 (three) times daily.    Dispense:  90 capsule    Refill:  3  . fluticasone furoate-vilanterol (BREO ELLIPTA) 100-25 MCG/INH AEPB    Sig: Inhale 1 puff into the lungs daily.    Dispense:  1 each    Refill:  2  . sildenafil (VIAGRA) 50 MG tablet    Sig: Take 1 tablet (50 mg total) by mouth daily as needed for erectile dysfunction.    Dispense:  10 tablet    Refill:  2    Follow-up: Return in about 3 months (around 05/18/2021) for Annual physical.    Lindell Spar, MD

## 2021-02-16 NOTE — Assessment & Plan Note (Signed)
Previous X-ray lumbar spine reviewed Refilled Gabapentin

## 2021-02-16 NOTE — Assessment & Plan Note (Signed)
Occasional morning erections Could be related to anxiety Has tried Viagra in the past, was not able to get it due to insurance denial - he is going to try with coupon.

## 2021-02-19 ENCOUNTER — Ambulatory Visit: Payer: Medicare Other | Admitting: Internal Medicine

## 2021-02-19 ENCOUNTER — Encounter (INDEPENDENT_AMBULATORY_CARE_PROVIDER_SITE_OTHER): Payer: Self-pay | Admitting: *Deleted

## 2021-02-28 ENCOUNTER — Other Ambulatory Visit: Payer: Self-pay | Admitting: Internal Medicine

## 2021-02-28 DIAGNOSIS — M1710 Unilateral primary osteoarthritis, unspecified knee: Secondary | ICD-10-CM
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
## 2021-03-13 ENCOUNTER — Encounter: Payer: Self-pay | Admitting: Psychiatry

## 2021-03-13 ENCOUNTER — Other Ambulatory Visit: Payer: Self-pay

## 2021-03-13 ENCOUNTER — Telehealth (INDEPENDENT_AMBULATORY_CARE_PROVIDER_SITE_OTHER): Payer: Medicare Other | Admitting: Psychiatry

## 2021-03-13 DIAGNOSIS — F331 Major depressive disorder, recurrent, moderate: Secondary | ICD-10-CM

## 2021-03-13 DIAGNOSIS — F4310 Post-traumatic stress disorder, unspecified: Secondary | ICD-10-CM | POA: Diagnosis not present

## 2021-03-13 MED ORDER — VENLAFAXINE HCL ER 150 MG PO CP24: ORAL_CAPSULE | ORAL | 2 refills | Status: DC

## 2021-03-13 MED ORDER — VENLAFAXINE HCL ER 75 MG PO CP24: ORAL_CAPSULE | ORAL | 2 refills | Status: DC

## 2021-03-13 MED ORDER — BUPROPION HCL ER (XL) 150 MG PO TB24: 150.0000 mg | ORAL_TABLET | Freq: Every day | ORAL | 1 refills | Status: DC

## 2021-03-13 NOTE — Patient Instructions (Signed)
1.Continue venlafaxine 225 mg daily  2. Start bupropion 150 mg daily  3.Next appointment:7/5 at 9:30

## 2021-03-20 ENCOUNTER — Other Ambulatory Visit: Payer: Self-pay | Admitting: Internal Medicine

## 2021-03-20 DIAGNOSIS — M1710 Unilateral primary osteoarthritis, unspecified knee: Secondary | ICD-10-CM

## 2021-03-21 MED ORDER — CYCLOBENZAPRINE HCL 10 MG PO TABS: 10.0000 mg | ORAL_TABLET | Freq: Three times a day (TID) | ORAL | 0 refills | Status: DC | PRN

## 2021-03-23 ENCOUNTER — Ambulatory Visit (HOSPITAL_COMMUNITY): Payer: Medicare Other | Admitting: Psychiatry

## 2021-03-29 ENCOUNTER — Other Ambulatory Visit: Payer: Self-pay

## 2021-03-29 ENCOUNTER — Ambulatory Visit (INDEPENDENT_AMBULATORY_CARE_PROVIDER_SITE_OTHER): Payer: Medicare Other | Admitting: Psychiatry

## 2021-03-29 DIAGNOSIS — F331 Major depressive disorder, recurrent, moderate: Secondary | ICD-10-CM | POA: Diagnosis not present

## 2021-03-29 DIAGNOSIS — F4310 Post-traumatic stress disorder, unspecified: Secondary | ICD-10-CM | POA: Diagnosis not present

## 2021-03-29 NOTE — Progress Notes (Signed)
Virtual Visit via Video Note  I connected with Keith Nolan on 03/29/21 at 3:09 Pm EDT  by a video enabled telemedicine application and verified that I am speaking with the correct person using two identifiers.  Location: Patient: Home Provider: [place] office    I discussed the limitations of evaluation and management by telemedicine and the availability of in person appointments. The patient expressed understanding and agreed to proceed.   I provided 41 minutes of non-face-to-face time during this encounter.   [name], LCSW    THERAPIST PROGRESS NOTE Alert Session Time: Thursday 03/29/2021 3:09 PM - 3:50 PM   Participation Level: Active  Behavioral Response: CasualAlertAnxious and Depressed  Type of Therapy: Individual Therapy  Treatment Goals addressed: Elevate mood and show evidence of usual energy, activities, and socialization as evidenced by patient decreasing depressed mood from 5 to 6 days/week to 2 days/week consecutively for 3 weeks per patient's self-report  Interventions: CBT and Supportive  Summary: Keith Nolan is a 50 y.o. male who  is referred for services by psychiatrist Dr. Modesta Messing due to symptoms of depression. Pt has had 2 psychiatric hospitalizations with the last one about 12 years ago at [facility] due to anxiety and depression. Pt has participated in therapy intermittently throughtout his life and reports last being seen 61 ys ago in [place].  Patient reports he tends to isolate self.  He states not doing anything fun and worrying too much.  Patient last was seen via virtual visit for the assessment appointment about 3 months ago.  Patient reports little to no change in symptoms, he expresses frustration regarding his living situation as he has been living with friends for 12 years.  Per his report, one of his friends manages his money.  However patient states he is able to manage his own money and would like to move into his own  place but does not have anywhere to go at this time.  He reports little to no involvement in any activities and reports no structure in his day.  He still tends to isolate self.  He reports poor concentration, poor motivation, depressed mood, and excessive worry.      Suicidal/Homicidal: Nowithout intent/plan  Therapist Response: Reviewed symptoms, discussed stressors, facilitated expression of thoughts and feelings, validated feelings, discussed the role of structure/routine/behavioral activation in coping with depression, will send patient handouts on daily planning and activity menu in preparation for next session, began to discuss anxiety and the stress response, discussed practicing deep breathing to trigger relaxation response (however, patient reports deep breathing does not work for him) will send patient handout on other relaxation techniques, developed plan with patient to review handouts  Plan: Return again in 2 weeks.  Diagnosis: Axis I: Post Traumatic Stress Disorder     MDD    [name], LCSW 03/29/2021

## 2021-04-03 ENCOUNTER — Encounter: Payer: Self-pay | Admitting: Internal Medicine

## 2021-04-03 ENCOUNTER — Ambulatory Visit (INDEPENDENT_AMBULATORY_CARE_PROVIDER_SITE_OTHER): Payer: Medicare Other

## 2021-04-03 ENCOUNTER — Telehealth: Payer: Self-pay | Admitting: Internal Medicine

## 2021-04-03 ENCOUNTER — Other Ambulatory Visit: Payer: Self-pay

## 2021-04-03 ENCOUNTER — Telehealth (INDEPENDENT_AMBULATORY_CARE_PROVIDER_SITE_OTHER): Payer: Medicare Other | Admitting: Internal Medicine

## 2021-04-03 VITALS — Ht 66.0 in | Wt 135.0 lb

## 2021-04-03 DIAGNOSIS — Z0000 Encounter for general adult medical examination without abnormal findings: Secondary | ICD-10-CM | POA: Diagnosis not present

## 2021-04-03 DIAGNOSIS — Z1211 Encounter for screening for malignant neoplasm of colon: Secondary | ICD-10-CM

## 2021-04-03 DIAGNOSIS — J019 Acute sinusitis, unspecified: Secondary | ICD-10-CM | POA: Diagnosis not present

## 2021-04-03 MED ORDER — FLUTICASONE PROPIONATE 50 MCG/ACT NA SUSP: 2.0000 | Freq: Every day | NASAL | 6 refills | Status: DC

## 2021-04-03 MED ORDER — AMOXICILLIN-POT CLAVULANATE 875-125 MG PO TABS: 1.0000 | ORAL_TABLET | Freq: Two times a day (BID) | ORAL | 0 refills | Status: DC

## 2021-04-03 NOTE — Progress Notes (Signed)
Subjective:   Keith Nolan is a 50 y.o. male who presents for Medicare Annual/Subsequent preventive examination.  Review of Systems           Objective:    There were no vitals filed for this visit. There is no height or weight on file to calculate BMI.  Advanced Directives 12/16/2019 11/21/2019 04/07/2019 12/07/2018 11/06/2018 10/27/2016 10/01/2016  Does Patient Have a Medical Advance Directive? No No No No No No No  Would patient like information on creating a medical advance directive? No - Patient declined No - Patient declined - - - - No - Patient declined    Current Medications (verified) Outpatient Encounter Medications as of 04/03/2021  Medication Sig   albuterol (VENTOLIN HFA) 108 (90 Base) MCG/ACT inhaler Inhale 2 puffs into the lungs every 6 (six) hours as needed for wheezing or shortness of breath.   buPROPion (WELLBUTRIN XL) 150 MG 24 hr tablet Take 1 tablet (150 mg total) by mouth daily.   cyclobenzaprine (FLEXERIL) 10 MG tablet Take 1 tablet (10 mg total) by mouth 3 (three) times daily as needed for muscle spasms.   fluticasone furoate-vilanterol (BREO ELLIPTA) 100-25 MCG/INH AEPB Inhale 1 puff into the lungs daily.   gabapentin (NEURONTIN) 100 MG capsule Take 1 capsule (100 mg total) by mouth 3 (three) times daily.   sildenafil (VIAGRA) 50 MG tablet Take 1 tablet (50 mg total) by mouth daily as needed for erectile dysfunction.   venlafaxine XR (EFFEXOR-XR) 150 MG 24 hr capsule 225 mg daily. Take along with 75 mg cap   venlafaxine XR (EFFEXOR-XR) 75 MG 24 hr capsule 225 mg daily. Take along with 150 mg cap   No facility-administered encounter medications on file as of 04/03/2021.    Allergies (verified) Other   History: Past Medical History:  Diagnosis Date   COPD (chronic obstructive pulmonary disease) (HCC)    DJD (degenerative joint disease)    Seasonal allergies    SHOULDER DISLOCATION-RECURRENT 10/25/2010   Qualifier: Diagnosis of  By: Aline Brochure MD, Madelyn Brunner PATELLAR (MALALIGNMENT) 10/25/2010   Qualifier: Diagnosis of  By: Aline Brochure MD, Dorothyann Peng     No past surgical history on file. Family History  Problem Relation Age of Onset   Anxiety disorder Mother    Depression Mother    Anxiety disorder Father    Depression Father    Alcohol abuse Father    Alcohol abuse Maternal Grandfather    Social History   Socioeconomic History   Marital status: Single    Spouse name: Not on file   Number of children: Not on file   Years of education: Not on file   Highest education level: Not on file  Occupational History   Not on file  Tobacco Use   Smoking status: Every Day    Packs/day: 1.00    Years: 30.00    Pack years: 30.00    Types: Cigarettes   Smokeless tobacco: Never   Tobacco comments:    provided pt with [place] Quit line information  Vaping Use   Vaping Use: Never used  Substance and Sexual Activity   Alcohol use: No   Drug use: No   Sexual activity: Never  Other Topics Concern   Not on file  Social History Narrative   Not on file   Social Determinants of Health   Financial Resource Strain: Not on file  Food Insecurity: Not on file  Transportation Needs: Not on file  Physical Activity:  Not on file  Stress: Not on file  Social Connections: Not on file    Tobacco Counseling Ready to quit: Not Answered Counseling given: Not Answered Tobacco comments: provided pt with [place] Quit line information   Clinical Intake:                 Diabetic? no         Activities of Daily Living No flowsheet data found.  Patient Care Team: Lindell Spar, MD as PCP - General (Internal Medicine)  Indicate any recent Medical Services you may have received from other than [facility] providers in the past year (date may be approximate).     Assessment:   This is a routine wellness examination for Keith Nolan.  Hearing/Vision screen No results found.  Dietary issues and exercise activities discussed:     Goals  Addressed   None   Depression Screen PHQ 2/9 Scores 02/16/2021 11/28/2020 11/20/2020  PHQ - 2 Score 0 0 6  PHQ- 9 Score - - 19  Some encounter information is confidential and restricted. Go to Review Flowsheets activity to see all data.    Fall Risk Fall Risk  02/16/2021 11/28/2020 11/20/2020  Falls in the past year? 0 0 0  Number falls in past yr: 0 0 0  Injury with Fall? 0 0 0  Risk for fall due to : No Fall Risks No Fall Risks No Fall Risks  Follow up Falls evaluation completed Falls evaluation completed Falls evaluation completed    [place]:  Any stairs in or around the home? Yes  If so, are there any without handrails? No  Home free of loose throw rugs in walkways, pet beds, electrical cords, etc? Yes  Adequate lighting in your home to reduce risk of falls? Yes   ASSISTIVE DEVICES UTILIZED TO PREVENT FALLS:  Life alert? No  Use of a cane, walker or w/c? No  Grab bars in the bathroom? Yes  Shower chair or bench in shower? No  Elevated toilet seat or a handicapped toilet? No   TIMED UP AND GO:  Was the test performed? No .  Length of time to ambulate n/a    Cognitive Function:        Immunizations Immunization History  Administered Date(s) Administered   Influenza-Unspecified 08/17/2014, 09/14/2018   Moderna Sars-Covid-2 Vaccination 08/30/2020, 09/29/2020    TDAP status: Due, Education has been provided regarding the importance of this vaccine. Advised may receive this vaccine at local pharmacy or Health Dept. Aware to provide a copy of the vaccination record if obtained from local pharmacy or Health Dept. Verbalized acceptance and understanding.  Flu Vaccine status: Up to date  Pneumococcal vaccine status: Declined,  Education has been provided regarding the importance of this vaccine but patient still declined. Advised may receive this vaccine at local pharmacy or Health Dept. Aware to provide a copy of the vaccination record if  obtained from local pharmacy or Health Dept. Verbalized acceptance and understanding.   Covid-19 vaccine status: Completed vaccines  Qualifies for Shingles Vaccine? Yes   Zostavax completed No   Shingrix Completed?: No.    Education has been provided regarding the importance of this vaccine. Patient has been advised to call insurance company to determine out of pocket expense if they have not yet received this vaccine. Advised may also receive vaccine at local pharmacy or Health Dept. Verbalized acceptance and understanding.  Screening Tests Health Maintenance  Topic Date Due   Pneumococcal  Vaccine 81-36 Years old (1 - PCV) Never done   HIV Screening  Never done   Hepatitis C Screening  Never done   TETANUS/TDAP  Never done   COLONOSCOPY (Pts 45-61yrs Insurance coverage will need to be confirmed)  Never done   Zoster Vaccines- Shingrix (1 of 2) Never done   COVID-19 Vaccine (3 - Booster for Moderna series) 02/27/2021   INFLUENZA VACCINE  05/21/2021   HPV VACCINES  Aged Out    Health Maintenance  Health Maintenance Due  Topic Date Due   Pneumococcal Vaccine 64-72 Years old (1 - PCV) Never done   HIV Screening  Never done   Hepatitis C Screening  Never done   TETANUS/TDAP  Never done   COLONOSCOPY (Pts 45-62yrs Insurance coverage will need to be confirmed)  Never done   Zoster Vaccines- Shingrix (1 of 2) Never done   COVID-19 Vaccine (3 - Booster for Moderna series) 02/27/2021    Colorectal Screening: Declined  Lung Cancer Screening: (Low Dose CT Chest recommended if Age 63-80 years, 30 pack-year currently smoking OR have quit w/in 15years.) does qualify.   Lung Cancer Screening Referral: Declined  Additional Screening:  Hepatitis C Screening: does not qualify; Completed  Vision Screening: Recommended annual ophthalmology exams for early detection of glaucoma and other disorders of the eye. Is the patient up to date with their annual eye exam?  Yes  Who is the provider or  what is the name of the office in which the patient attends annual eye exams? [facility] If pt is not established with a provider, would they like to be referred to a provider to establish care?  N/a .   Dental Screening: Recommended annual dental exams for proper oral hygiene  Community Resource Referral / Chronic Care Management: CRR required this visit?  No   CCM required this visit?  No      Plan:     I have personally reviewed and noted the following in the patient's chart:   Medical and social history Use of alcohol, tobacco or illicit drugs  Current medications and supplements including opioid prescriptions. Patient is not currently taking opioid prescriptions. Functional ability and status Nutritional status Physical activity Advanced directives List of other physicians Hospitalizations, surgeries, and ER visits in previous 12 months Vitals Screenings to include cognitive, depression, and falls Referrals and appointments  In addition, I have reviewed and discussed with patient certain preventive protocols, quality metrics, and best practice recommendations. A written personalized care plan for preventive services as well as general preventive health recommendations were provided to patient.     Laretta Bolster, [place]   2/83/1517   Nurse Notes: AWV conducted by nurse in office by phone. Patient gave consent to telehealth visit via audio. Patient at home at the time of this visit. Provider here in the office at the time of this visit. Visit took 30 minutes to complete.

## 2021-04-03 NOTE — Progress Notes (Signed)
Virtual Visit via Telephone Note   This visit type was conducted due to national recommendations for restrictions regarding the COVID-19 Pandemic (e.g. social distancing) in an effort to limit this patient's exposure and mitigate transmission in our community.  Due to his co-morbid illnesses, this patient is at least at moderate risk for complications without adequate follow up.  This format is felt to be most appropriate for this patient at this time.  The patient did not have access to video technology/had technical difficulties with video requiring transitioning to audio format only (telephone).  All issues noted in this document were discussed and addressed.  No physical exam could be performed with this format.   Evaluation Performed:  Follow-up visit  Date:  04/03/2021   ID:  Keith Nolan, DOB 1971-09-10, MRN [id number]  Patient Location: Home Provider Location: Office/Clinic  Participants: Patient Location of Patient: Home Location of Provider: Telehealth Consent was obtain for visit to be over via telehealth. I verified that I am speaking with the correct person using two identifiers.  PCP:  Lindell Spar, MD   Chief Complaint:  Nasal congestion and sinus pressure  History of Present Illness:    Keith Nolan is a 50 y.o. male who has a televisit for c/o nasal congestion, sore throat, cough and fatigue for last 3 days. He has had negative home COVID test on the same day when his symptoms started. Denies any dyspnea or wheezing. Has h/o allergic rhinitis, but states that it usually does not give him such severe symptoms.  The patient does have symptoms concerning for COVID-19 infection (fever, chills, cough, or new shortness of breath).   Past Medical, Surgical, Social History, Allergies, and Medications have been Reviewed.  Past Medical History:  Diagnosis Date   COPD (chronic obstructive pulmonary disease) ([place])    DJD (degenerative joint disease)    Seasonal  allergies    SHOULDER DISLOCATION-RECURRENT 10/25/2010   Qualifier: Diagnosis of  By: Aline Brochure MD, Madelyn Brunner PATELLAR (MALALIGNMENT) 10/25/2010   Qualifier: Diagnosis of  By: Aline Brochure MD, Dorothyann Peng     History reviewed. No pertinent surgical history.   Current Meds  Medication Sig   albuterol (VENTOLIN HFA) 108 (90 Base) MCG/ACT inhaler Inhale 2 puffs into the lungs every 6 (six) hours as needed for wheezing or shortness of breath.   amoxicillin-clavulanate (AUGMENTIN) 875-125 MG tablet Take 1 tablet by mouth 2 (two) times daily.   buPROPion (WELLBUTRIN XL) 150 MG 24 hr tablet Take 1 tablet (150 mg total) by mouth daily.   cyclobenzaprine (FLEXERIL) 10 MG tablet Take 1 tablet (10 mg total) by mouth 3 (three) times daily as needed for muscle spasms.   fluticasone (FLONASE) 50 MCG/ACT nasal spray Place 2 sprays into both nostrils daily.   fluticasone furoate-vilanterol (BREO ELLIPTA) 100-25 MCG/INH AEPB Inhale 1 puff into the lungs daily.   gabapentin (NEURONTIN) 100 MG capsule Take 1 capsule (100 mg total) by mouth 3 (three) times daily.   sildenafil (VIAGRA) 50 MG tablet Take 1 tablet (50 mg total) by mouth daily as needed for erectile dysfunction.   venlafaxine XR (EFFEXOR-XR) 150 MG 24 hr capsule 225 mg daily. Take along with 75 mg cap   venlafaxine XR (EFFEXOR-XR) 75 MG 24 hr capsule 225 mg daily. Take along with 150 mg cap     Allergies:   Other   ROS:   Please see the history of present illness.     All other  systems reviewed and are negative.   Labs/Other Tests and Data Reviewed:    Recent Labs: No results found for requested labs within last 8760 hours.   Recent Lipid Panel Lab Results  Component Value Date/Time   CHOL 108 07/10/2018 12:00 AM   TRIG 63 07/10/2018 12:00 AM   HDL 35 07/10/2018 12:00 AM   LDLCALC 59 07/10/2018 12:00 AM    Wt Readings from Last 3 Encounters:  04/03/21 135 lb (61.2 kg)  02/16/21 139 lb 6.4 oz (63.2 kg)  11/20/20 136 lb (61.7  kg)     ASSESSMENT & PLAN:    Acute sinusitis Advised to get COVID RT-PCR Started empiric Augmentin for now Flonase for allergic symptoms Nasal saline spray for nasal congestion  Time:   Today, I have spent 9 minutes reviewing the chart, including problem list, medications, and with the patient with telehealth technology discussing the above problems.   Medication Adjustments/Labs and Tests Ordered: Current medicines are reviewed at length with the patient today.  Concerns regarding medicines are outlined above.   Tests Ordered: No orders of the defined types were placed in this encounter.   Medication Changes: Meds ordered this encounter  Medications   amoxicillin-clavulanate (AUGMENTIN) 875-125 MG tablet    Sig: Take 1 tablet by mouth 2 (two) times daily.    Dispense:  20 tablet    Refill:  0   fluticasone (FLONASE) 50 MCG/ACT nasal spray    Sig: Place 2 sprays into both nostrils daily.    Dispense:  16 g    Refill:  6     Note: This dictation was prepared with Dragon dictation along with smaller phrase technology. Similar sounding words can be transcribed inadequately or may not be corrected upon review. Any transcriptional errors that result from this process are unintentional.      Disposition:  Follow up  Signed, Lindell Spar, MD  04/03/2021 3:25 PM     [facility]

## 2021-04-03 NOTE — Telephone Encounter (Signed)
Left message for patient to call back and schedule Medicare Annual Wellness Visit (AWV) either virtually or in office.   AWV-I PER [place] 10/21/2009 please schedule at anytime with [facility]  health coach  This should be a 40 minute visit.

## 2021-04-03 NOTE — Patient Instructions (Signed)
Keith Nolan , Thank you for taking time to come for your Medicare Wellness Visit. I appreciate your ongoing commitment to your health goals. Please review the following plan we discussed and let me know if I can assist you in the future.   Screening recommendations/referrals: Colonoscopy: Ordered Recommended yearly ophthalmology/optometry visit for glaucoma screening and checkup Recommended yearly dental visit for hygiene and checkup  Vaccinations: Influenza vaccine: Fall 2022 Pneumococcal vaccine: Declined Tdap vaccine: Declined Shingles vaccine: Declined    Advanced directives: No  Conditions/risks identified: None  Next appointment: 04/21/21 @ 3:20 pm  Preventive Care 40-64 Years, Male Preventive care refers to lifestyle choices and visits with your health care provider that can promote health and wellness. What does preventive care include? A yearly physical exam. This is also called an annual well check. Dental exams once or twice a year. Routine eye exams. Ask your health care provider how often you should have your eyes checked. Personal lifestyle choices, including: Daily care of your teeth and gums. Regular physical activity. Eating a healthy diet. Avoiding tobacco and drug use. Limiting alcohol use. Practicing safe sex. Taking low-dose aspirin every day starting at age 56. What happens during an annual well check? The services and screenings done by your health care provider during your annual well check will depend on your age, overall health, lifestyle risk factors, and family history of disease. Counseling  Your health care provider may ask you questions about your: Alcohol use. Tobacco use. Drug use. Emotional well-being. Home and relationship well-being. Sexual activity. Eating habits. Work and work Statistician. Screening  You may have the following tests or measurements: Height, weight, and BMI. Blood pressure. Lipid and cholesterol levels. These may  be checked every 5 years, or more frequently if you are over 38 years old. Skin check. Lung cancer screening. You may have this screening every year starting at age 32 if you have a 30-pack-year history of smoking and currently smoke or have quit within the past 15 years. Fecal occult blood test (FOBT) of the stool. You may have this test every year starting at age 34. Flexible sigmoidoscopy or colonoscopy. You may have a sigmoidoscopy every 5 years or a colonoscopy every 10 years starting at age 14. Prostate cancer screening. Recommendations will vary depending on your family history and other risks. Hepatitis C blood test. Hepatitis B blood test. Sexually transmitted disease (STD) testing. Diabetes screening. This is done by checking your blood sugar (glucose) after you have not eaten for a while (fasting). You may have this done every 1-3 years. Discuss your test results, treatment options, and if necessary, the need for more tests with your health care provider. Vaccines  Your health care provider may recommend certain vaccines, such as: Influenza vaccine. This is recommended every year. Tetanus, diphtheria, and acellular pertussis (Tdap, Td) vaccine. You may need a Td booster every 10 years. Zoster vaccine. You may need this after age 50. Pneumococcal 13-valent conjugate (PCV13) vaccine. You may need this if you have certain conditions and have not been vaccinated. Pneumococcal polysaccharide (PPSV23) vaccine. You may need one or two doses if you smoke cigarettes or if you have certain conditions. Talk to your health care provider about which screenings and vaccines you need and how often you need them. This information is not intended to replace advice given to you by your health care provider. Make sure you discuss any questions you have with your health care provider. Document Released: 11/03/2015 Document Revised: 06/26/2016 Document Reviewed: 08/08/2015  Chartered certified accountant Patient  Education  2017 [place] Prevention in the [facility] can cause injuries. They can happen to people of all ages. There are many things you can do to make your home safe and to help prevent falls. What can I do on the outside of my home? Regularly fix the edges of walkways and driveways and fix any cracks. Remove anything that might make you trip as you walk through a door, such as a raised step or threshold. Trim any bushes or trees on the path to your home. Use bright outdoor lighting. Clear any walking paths of anything that might make someone trip, such as rocks or tools. Regularly check to see if handrails are loose or broken. Make sure that both sides of any steps have handrails. Any raised decks and porches should have guardrails on the edges. Have any leaves, snow, or ice cleared regularly. Use sand or salt on walking paths during winter. Clean up any spills in your garage right away. This includes oil or grease spills. What can I do in the bathroom? Use night lights. Install grab bars by the toilet and in the tub and shower. Do not use towel bars as grab bars. Use non-skid mats or decals in the tub or shower. If you need to sit down in the shower, use a plastic, non-slip stool. Keep the floor dry. Clean up any water that spills on the floor as soon as it happens. Remove soap buildup in the tub or shower regularly. Attach bath mats securely with double-sided non-slip rug tape. Do not have throw rugs and other things on the floor that can make you trip. What can I do in the bedroom? Use night lights. Make sure that you have a light by your bed that is easy to reach. Do not use any sheets or blankets that are too big for your bed. They should not hang down onto the floor. Have a firm chair that has side arms. You can use this for support while you get dressed. Do not have throw rugs and other things on the floor that can make you trip. What can I do in the  kitchen? Clean up any spills right away. Avoid walking on wet floors. Keep items that you use a lot in easy-to-reach places. If you need to reach something above you, use a strong step stool that has a grab bar. Keep electrical cords out of the way. Do not use floor polish or wax that makes floors slippery. If you must use wax, use non-skid floor wax. Do not have throw rugs and other things on the floor that can make you trip. What can I do with my stairs? Do not leave any items on the stairs. Make sure that there are handrails on both sides of the stairs and use them. Fix handrails that are broken or loose. Make sure that handrails are as long as the stairways. Check any carpeting to make sure that it is firmly attached to the stairs. Fix any carpet that is loose or worn. Avoid having throw rugs at the top or bottom of the stairs. If you do have throw rugs, attach them to the floor with carpet tape. Make sure that you have a light switch at the top of the stairs and the bottom of the stairs. If you do not have them, ask someone to add them for you. What else can I do to help prevent falls? Wear shoes that: Do not have  high heels. Have rubber bottoms. Are comfortable and fit you well. Are closed at the toe. Do not wear sandals. If you use a stepladder: Make sure that it is fully opened. Do not climb a closed stepladder. Make sure that both sides of the stepladder are locked into place. Ask someone to hold it for you, if possible. Clearly mark and make sure that you can see: Any grab bars or handrails. First and last steps. Where the edge of each step is. Use tools that help you move around (mobility aids) if they are needed. These include: Canes. Walkers. Scooters. Crutches. Turn on the lights when you go into a dark area. Replace any light bulbs as soon as they burn out. Set up your furniture so you have a clear path. Avoid moving your furniture around. If any of your floors are  uneven, fix them. If there are any pets around you, be aware of where they are. Review your medicines with your doctor. Some medicines can make you feel dizzy. This can increase your chance of falling. Ask your doctor what other things that you can do to help prevent falls. This information is not intended to replace advice given to you by your health care provider. Make sure you discuss any questions you have with your health care provider. Document Released: 08/03/2009 Document Revised: 03/14/2016 Document Reviewed: 11/11/2014 Elsevier Interactive Patient Education  2017 [employer].

## 2021-04-09 ENCOUNTER — Ambulatory Visit: Payer: Medicare Other

## 2021-04-11 ENCOUNTER — Other Ambulatory Visit: Payer: Self-pay

## 2021-04-11 ENCOUNTER — Ambulatory Visit (INDEPENDENT_AMBULATORY_CARE_PROVIDER_SITE_OTHER): Payer: Medicare Other | Admitting: Psychiatry

## 2021-04-11 ENCOUNTER — Encounter (INDEPENDENT_AMBULATORY_CARE_PROVIDER_SITE_OTHER): Payer: Self-pay | Admitting: *Deleted

## 2021-04-11 DIAGNOSIS — F4310 Post-traumatic stress disorder, unspecified: Secondary | ICD-10-CM

## 2021-04-11 DIAGNOSIS — F331 Major depressive disorder, recurrent, moderate: Secondary | ICD-10-CM | POA: Diagnosis not present

## 2021-04-11 NOTE — Progress Notes (Signed)
Virtual Visit via Video Note  I connected with Keith Nolan on 04/11/21 at 4:10 PM EDT  by a video enabled telemedicine application and verified that I am speaking with the correct person using two identifiers.  Location: Patient: Home Provider: [place] office    I discussed the limitations of evaluation and management by telemedicine and the availability of in person appointments. The patient expressed understanding and agreed to proceed.   I provide 40 minutes of non-face-to-face time during this encounter.   Keith Smoker, LCSW \   THERAPIST PROGRESS NOTE  Session Time: Thursday 04/11/2021 4:10 PM - 4:50 PM   Participation Level: Active  Behavioral Response: CasualAlertAnxious and Depressed  Type of Therapy: Individual Therapy  Treatment Goals addressed: Elevate mood and show evidence of usual energy, activities, and socialization as evidenced by patient decreasing depressed mood from 5 to 6 days/week to 2 days/week consecutively for 3 weeks per patient's self-report  Interventions: CBT and Supportive  Summary: [name] is a 50 y.o. male who  is referred for services by psychiatrist Dr. Modesta Messing due to symptoms of depression. Pt has had 2 psychiatric hospitalizations with the last one about 12 years ago at [facility] due to anxiety and depression. Pt has participated in therapy intermittently throughtout his life and reports last being seen 54 ys ago in [place].  Patient reports he tends to isolate self.  He states not doing anything fun and worrying too much.  Patient last was seen via virtual visit about 2 weeks ago.  He reports no change in symptoms and reports continued poor concentration, poor motivation, depressed mood, and excessive worry.  Patient still reports little to no involvement in activity.  He states he has gone walking once or twice since last session.  He continues to express frustration he does not have his own place.  He also  expresses frustration as he reports the people he resides with expect him to do things that are not his responsibility.  He reports he has expresses concerns to them before but says they do not seem to care.  Patient states not really wanting to say anything else to do them as it has not worked.  He also dismisses any suggestions regarding trying to increase involvement in activity.  He states he really wants to work on trying to remove what ever is blocking memories in his head about things that may have happened to him in the past.  He reports remembering some of his trauma history but suspects there is more he cannot access.     Suicidal/Homicidal: Nowithout intent/plan  Therapist Response: Reviewed symptoms, discussed stressors, facilitated expression of thoughts and feelings, validated feelings, reiterated the role of behavioral activation in coping with stress and depression, tried to assist patient identify ways to increase behavioral activation, began to discuss patient's concerns regarding possible repressed memories, will discuss other resources at next session    Plan: Return again in 2 weeks.  Diagnosis: Axis I: Post Traumatic Stress Disorder     MDD    Keith Smoker, LCSW 04/11/2021

## 2021-04-12 ENCOUNTER — Ambulatory Visit (HOSPITAL_COMMUNITY): Payer: Medicare Other | Admitting: Psychiatry
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
## 2021-04-24 ENCOUNTER — Other Ambulatory Visit: Payer: Self-pay

## 2021-04-24 ENCOUNTER — Encounter: Payer: Self-pay | Admitting: Psychiatry

## 2021-04-24 ENCOUNTER — Telehealth (INDEPENDENT_AMBULATORY_CARE_PROVIDER_SITE_OTHER): Payer: Medicare Other | Admitting: Psychiatry

## 2021-04-24 DIAGNOSIS — F331 Major depressive disorder, recurrent, moderate: Secondary | ICD-10-CM | POA: Diagnosis not present

## 2021-04-24 DIAGNOSIS — F4310 Post-traumatic stress disorder, unspecified: Secondary | ICD-10-CM

## 2021-04-24 MED ORDER — MIRTAZAPINE 15 MG PO TABS: ORAL_TABLET | ORAL | 1 refills | Status: DC

## 2021-04-24 NOTE — Patient Instructions (Signed)
1. Continue venlafaxine 225 mg daily  2. Discontinue bupropion  3. Start mirtazapine 7.5 mg for one week, then 15 mg at night 4. Start trazodone 25-50 mg at night as needed for sleep 3. Next appointment: 8/23 at 9 AM

## 2021-04-26 ENCOUNTER — Other Ambulatory Visit: Payer: Self-pay

## 2021-04-26 ENCOUNTER — Ambulatory Visit (INDEPENDENT_AMBULATORY_CARE_PROVIDER_SITE_OTHER): Payer: Medicare Other | Admitting: Psychiatry

## 2021-04-26 DIAGNOSIS — F4310 Post-traumatic stress disorder, unspecified: Secondary | ICD-10-CM | POA: Diagnosis not present

## 2021-04-26 DIAGNOSIS — F331 Major depressive disorder, recurrent, moderate: Secondary | ICD-10-CM

## 2021-04-26 NOTE — Progress Notes (Signed)
Virtual Visit via Video Note  I connected with Keith Nolan on 04/26/21 at 4:08 PM EDT by a video enabled telemedicine application and verified that I am speaking with the correct person using two identifiers.  Location: Patient: Home Provider:  [place] office    I discussed the limitations of evaluation and management by telemedicine and the availability of in person appointments. The patient expressed understanding and agreed to proceed.  I provided 47 minutes of non-face-to-face time during this encounter.   [name], LCSW   THERAPIST PROGRESS NOTE  Session Time: Thursday 04/26/2021 4:08 PM - 4:55 PM  Participation Level: Active  Behavioral Response: CasualAlertAnxious and Depressed  Type of Therapy: Individual Therapy  Treatment Goals addressed: Elevate mood and show evidence of usual energy, activities, and socialization as evidenced by patient decreasing depressed mood from 5 to 6 days/week to 2 days/week consecutively for 3 weeks per patient's self-report  Interventions: CBT and Supportive  Summary: Keith Nolan is a 50 y.o. male who  is referred for services by psychiatrist Dr. Modesta Messing due to symptoms of depression. Pt has had 2 psychiatric hospitalizations with the last one about 12 years ago at [facility] due to anxiety and depression. Pt has participated in therapy intermittently throughtout his life and reports last being seen 80 ys ago in [place].  Patient reports he tends to isolate self.  He states not doing anything fun and worrying too much.  Patient last was seen via virtual visit about 2 weeks ago.  He reports no change in symptoms and reports continued poor concentration, poor motivation, depressed mood, and excessive worry.  Patient still reports little to no involvement in activity.  He states he has gone walking a few times since last session.  He continues to express frustration that he cannot access possible memories related to his  trauma history.  Patient reports the not knowing what is preventing him from doing things.    Suicidal/Homicidal: Nowithout intent/plan  Therapist Response: Reviewed symptoms, discussed stressors, facilitated expression of thoughts and feelings, validated feelings, continue to discuss patient's concerns regarding possible repressed memories, assisted patient identify the way he is relating to his thoughts about not accessing depressed memories, assisted patient identify ways to relate to thought differently in order to pursue value congruent activity, discussed patient's value of doing something/being productive, developed plan with patient to walk 4 days/week 30 minutes in the evening and 30 minutes in the morning, assisted patient identify thoughts and processes that may inhibit implementation of plan, continued,  to discuss other possible resources regarding retrieving repressed memories, also discussed the possibility of processing memories of trauma history he does have access to Plan: Return again in 2 weeks.  Diagnosis: Axis I: Post Traumatic Stress Disorder     MDD    [name], LCSW 04/26/2021

## 2021-05-10 ENCOUNTER — Other Ambulatory Visit: Payer: Self-pay

## 2021-05-10 ENCOUNTER — Ambulatory Visit (INDEPENDENT_AMBULATORY_CARE_PROVIDER_SITE_OTHER): Payer: Medicare Other | Admitting: Psychiatry

## 2021-05-10 DIAGNOSIS — F331 Major depressive disorder, recurrent, moderate: Secondary | ICD-10-CM

## 2021-05-10 DIAGNOSIS — F4310 Post-traumatic stress disorder, unspecified: Secondary | ICD-10-CM | POA: Diagnosis not present

## 2021-05-10 NOTE — Progress Notes (Signed)
Virtual Visit via Video Note  I connected with Keith Nolan on 05/10/21 at 4:08 PM EDT  by a video enabled telemedicine application and verified that I am speaking with the correct person using two identifiers.  Location: Patient: Home Provider:  [place] office    I discussed the limitations of evaluation and management by telemedicine and the availability of in person appointments. The patient expressed understanding and agreed to proceed.  I provided 45 minutes of non-face-to-face time during this encounter.   [name], LCSW   THERAPIST PROGRESS NOTE  Session Time: Thursday 05/10/2021 4:08 PM -  4:53 AM   Participation Level: Active  Behavioral Response: CasualAlertAnxious and Depressed  Type of Therapy: Individual Therapy  Treatment Goals addressed: Elevate mood and show evidence of usual energy, activities, and socialization as evidenced by patient decreasing depressed mood from 5 to 6 days/week to 2 days/week consecutively for 3 weeks per patient's self-report  Interventions: CBT and Supportive  Summary: Keith Nolan is a 50 y.o. male who  is referred for services by psychiatrist Dr. Modesta Messing due to symptoms of depression. Pt has had 2 psychiatric hospitalizations with the last one about 12 years ago at [facility] due to anxiety and depression. Pt has participated in therapy intermittently throughtout his life and reports last being seen 87 ys ago in [place].  Patient reports he tends to isolate self.  He states not doing anything fun and worrying too much.  Patient last was seen via virtual visit about 2 weeks ago.  He reports no change in symptoms and reports continued poor concentration, poor motivation, depressed mood, muscle tension, and excessive worry.  He he reports thoughts of not getting better unless he is able to resume use of Xanax as this helped him stay focused in the past.  Patient still reports little to no involvement in activity.  He  reports walking 3-4 times for about 10 minutes since last session.  Per patient report, weather prevented him from walking for longer periods of time as it was too hot.  He reports continued frustration about living in [place] and states he would like to leave.  He states wanting to get away and stay in some type of group facility for treatment.  He also reports wanting to get away from [place] as it triggers memories of life losing his parents who died about 6 years ago.  Patient reports he and the family with whom he resides are planning to go on vacation to [facility] for 3 to 4 days.  He expresses some anxiety about this as he has thoughts of the trip may not go well.  He also is worried about an upcoming colonoscopy in August.  Suicidal/Homicidal: Nowithout intent/plan  Therapist Response: Reviewed symptoms, assisted patient identify thoughts and processes that inhibited implementation of walking plan, praised and reinforced his efforts to walk, developed new plan of walking 10 minutes/day, discussed stressors, facilitated expression of thoughts and feelings, validated feelings, began to assist patient identify connection between thoughts/mood/and behavior, discussed rationale for and assisted patient practice progressive muscle relaxation to help cope with anxiety and stress, developed plan with patient to practice progressive muscle relaxation daily  Plan: Return again in 2 weeks.  Diagnosis: Axis I: Post Traumatic Stress Disorder     MDD    [name], LCSW 05/10/2021

## 2021-05-17 ENCOUNTER — Encounter: Payer: Medicare Other | Admitting: Internal Medicine
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
## 2021-06-12 ENCOUNTER — Other Ambulatory Visit: Payer: Self-pay

## 2021-06-12 ENCOUNTER — Telehealth (INDEPENDENT_AMBULATORY_CARE_PROVIDER_SITE_OTHER): Payer: Medicare Other | Admitting: Psychiatry

## 2021-06-12 ENCOUNTER — Encounter: Payer: Self-pay | Admitting: Psychiatry

## 2021-06-12 DIAGNOSIS — G4700 Insomnia, unspecified: Secondary | ICD-10-CM

## 2021-06-12 DIAGNOSIS — F4310 Post-traumatic stress disorder, unspecified: Secondary | ICD-10-CM

## 2021-06-12 DIAGNOSIS — F331 Major depressive disorder, recurrent, moderate: Secondary | ICD-10-CM | POA: Diagnosis not present

## 2021-06-12 MED ORDER — TRAZODONE HCL 50 MG PO TABS: 50.0000 mg | ORAL_TABLET | Freq: Every evening | ORAL | 1 refills | Status: DC | PRN

## 2021-06-12 MED ORDER — PRAZOSIN HCL 2 MG PO CAPS: ORAL_CAPSULE | ORAL | 1 refills | Status: DC

## 2021-06-12 MED ORDER — VENLAFAXINE HCL ER 75 MG PO CP24: ORAL_CAPSULE | ORAL | 2 refills | Status: DC

## 2021-06-12 MED ORDER — PRAZOSIN HCL 1 MG PO CAPS: 1.0000 mg | ORAL_CAPSULE | Freq: Every day | ORAL | 0 refills | Status: DC

## 2021-06-12 MED ORDER — VENLAFAXINE HCL ER 150 MG PO CP24: ORAL_CAPSULE | ORAL | 2 refills | Status: DC

## 2021-06-12 MED ORDER — MIRTAZAPINE 30 MG PO TABS: 30.0000 mg | ORAL_TABLET | Freq: Every day | ORAL | 1 refills | Status: DC

## 2021-06-12 NOTE — Patient Instructions (Signed)
1. Continue venlafaxine 225 mg daily  2. Increase mirtazapine 30 mg at night  3. Continue trazodone 25-50 mg at night as needed for sleep 5. Next appointment: 10/17 at 2 PM, in person  The next visit will be in person visit. Please arrive 15 mins before the scheduled time.   [facility]  Address: [address], [place], [place] [address]

## 2021-06-13 ENCOUNTER — Telehealth (HOSPITAL_COMMUNITY): Payer: Self-pay | Admitting: Psychiatry

## 2021-06-13 NOTE — Telephone Encounter (Signed)
D:  Dr. Modesta Messing referred pt to Keith Nolan.  A:  Placed call to pt, but there was no answer. Will attempt to try again later.  Inform Dr. Modesta Messing.

## 2021-06-18 ENCOUNTER — Ambulatory Visit (INDEPENDENT_AMBULATORY_CARE_PROVIDER_SITE_OTHER): Payer: Medicare Other | Admitting: Internal Medicine

## 2021-06-18 ENCOUNTER — Other Ambulatory Visit: Payer: Self-pay

## 2021-06-18 ENCOUNTER — Encounter: Payer: Self-pay | Admitting: Internal Medicine

## 2021-06-18 VITALS — BP 150/80 | HR 69 | Temp 98.1°F | Resp 18 | Ht 66.0 in | Wt 135.0 lb

## 2021-06-18 DIAGNOSIS — M5442 Lumbago with sciatica, left side: Secondary | ICD-10-CM | POA: Diagnosis not present

## 2021-06-18 DIAGNOSIS — G8929 Other chronic pain: Secondary | ICD-10-CM | POA: Diagnosis not present

## 2021-06-18 DIAGNOSIS — Z1159 Encounter for screening for other viral diseases: Secondary | ICD-10-CM

## 2021-06-18 DIAGNOSIS — Z0000 Encounter for general adult medical examination without abnormal findings: Secondary | ICD-10-CM

## 2021-06-18 DIAGNOSIS — Z114 Encounter for screening for human immunodeficiency virus [HIV]: Secondary | ICD-10-CM | POA: Diagnosis not present

## 2021-06-18 DIAGNOSIS — I10 Essential (primary) hypertension: Secondary | ICD-10-CM

## 2021-06-18 DIAGNOSIS — Z0001 Encounter for general adult medical examination with abnormal findings: Secondary | ICD-10-CM | POA: Insufficient documentation

## 2021-06-18 DIAGNOSIS — L309 Dermatitis, unspecified: Secondary | ICD-10-CM

## 2021-06-18 DIAGNOSIS — Z131 Encounter for screening for diabetes mellitus: Secondary | ICD-10-CM | POA: Diagnosis not present

## 2021-06-18 DIAGNOSIS — M5441 Lumbago with sciatica, right side: Secondary | ICD-10-CM

## 2021-06-18 MED ORDER — TRIAMCINOLONE ACETONIDE 0.1 % EX CREA: 1.0000 "application " | TOPICAL_CREAM | Freq: Two times a day (BID) | CUTANEOUS | 0 refills | Status: DC

## 2021-06-18 MED ORDER — LOSARTAN POTASSIUM 25 MG PO TABS: 25.0000 mg | ORAL_TABLET | Freq: Every day | ORAL | 0 refills | Status: DC

## 2021-06-18 NOTE — Patient Instructions (Signed)
Please start taking Losartan as prescribed.  Please get X-ray of the lumbar spine at [facility].  Please continue taking other medications as prescribed.  Please follow low salt diet and perform moderate exercise/walking at least 150 mins/week.

## 2021-06-18 NOTE — Assessment & Plan Note (Signed)
Has persistent back pain Check X-ray lumbar spine On Gabapentin 100 mg TID Will refer to PT if worsening symptoms

## 2021-06-18 NOTE — Assessment & Plan Note (Signed)
Annual exam as documented. Counseling done  re healthy lifestyle involving commitment to 150 minutes exercise per week, heart healthy diet, and attaining healthy weight.The importance of adequate sleep also discussed. Changes in health habits are decided on by the patient with goals and time frames  set for achieving them. Immunization and cancer screening needs are specifically addressed at this visit. 

## 2021-06-18 NOTE — Progress Notes (Signed)
Established Patient Office Visit  Subjective:  Patient ID: Keith Nolan, male    DOB: [birth year]  Age: 50 y.o. MRN: [id number]  CC:  Chief Complaint  Patient presents with   Follow-up    3 month follow up/ physical sometimes feels lightheaded feels like this is when his bp is up     HPI Keith Nolan is a 50 year old male with PMH of asthma with COPD, chronic low back pain, chronic opioid medication use in the past, anxiety with PTSD and tobacco abuse who presents for annual physical.  HTN: His BP was elevated today. He has had borderline elevated BP in the past visits as well. Of note, he was on Prazosin for psychiatric purposes, but does not take it now. He has been having dizziness at times and states that he feels that his BP runs high at those times. It improves when he tries to take rest. Denies any headache, chest pain, dyspnea or palpitations.  Chronic low back pain: He has been taking gabapentin, which has not been helping much.  He has not had x-ray of the lumbar spine done yet, which was ordered in the last visit.  Denies any numbness, tingling or weakness of the LE.  Denies any recent injury or fall.  He reports having a rash on his right forearm and itching from it. Denies any recent insect bite.  He is going to schedule appointment with GI for colonoscopy.  Past Medical History:  Diagnosis Date   COPD (chronic obstructive pulmonary disease) ([place])    DJD (degenerative joint disease)    Seasonal allergies    SHOULDER DISLOCATION-RECURRENT 10/25/2010   Qualifier: Diagnosis of  By: Aline Brochure MD, Madelyn Brunner PATELLAR (MALALIGNMENT) 10/25/2010   Qualifier: Diagnosis of  By: Aline Brochure MD, Dorothyann Peng      History reviewed. No pertinent surgical history.  Family History  Problem Relation Age of Onset   Anxiety disorder Mother    Depression Mother    Anxiety disorder Father    Depression Father    Alcohol abuse Father    Alcohol abuse Maternal Grandfather      Social History   Socioeconomic History   Marital status: Single    Spouse name: Not on file   Number of children: Not on file   Years of education: Not on file   Highest education level: Not on file  Occupational History   Not on file  Tobacco Use   Smoking status: Every Day    Packs/day: 1.00    Years: 30.00    Pack years: 30.00    Types: Cigarettes   Smokeless tobacco: Never   Tobacco comments:    provided pt with [place] Quit line information  Vaping Use   Vaping Use: Never used  Substance and Sexual Activity   Alcohol use: No   Drug use: No   Sexual activity: Never  Other Topics Concern   Not on file  Social History Narrative   Not on file   Social Determinants of Health   Financial Resource Strain: Low Risk    Difficulty of Paying Living Expenses: Not hard at all  Food Insecurity: No Food Insecurity   Worried About Charity fundraiser in the Last Year: Never true   Ran Out of Food in the Last Year: Never true  Transportation Needs: No Transportation Needs   Lack of Transportation (Medical): No   Lack of Transportation (Non-Medical): No  Physical Activity: Insufficiently Active  Days of Exercise per Week: 3 days   Minutes of Exercise per Session: 30 min  Stress: No Stress Concern Present   Feeling of Stress : Only a little  Social Connections: Moderately Isolated   Frequency of Communication with Friends and Family: More than three times a week   Frequency of Social Gatherings with Friends and Family: Never   Attends Religious Services: More than 4 times per year   Active Member of [employer] or Organizations: No   Attends Music therapist: Never   Marital Status: Never married  Human resources officer Violence: Not At Risk   Fear of Current or Ex-Partner: No   Emotionally Abused: No   Physically Abused: No   Sexually Abused: No    Outpatient Medications Prior to Visit  Medication Sig Dispense Refill   albuterol (VENTOLIN HFA) 108 (90 Base) MCG/ACT  inhaler Inhale 2 puffs into the lungs every 6 (six) hours as needed for wheezing or shortness of breath. 8 g 3   cyclobenzaprine (FLEXERIL) 10 MG tablet Take 1 tablet (10 mg total) by mouth 3 (three) times daily as needed for muscle spasms. 30 tablet 0   fluticasone (FLONASE) 50 MCG/ACT nasal spray Place 2 sprays into both nostrils daily. 16 g 6   fluticasone furoate-vilanterol (BREO ELLIPTA) 100-25 MCG/INH AEPB Inhale 1 puff into the lungs daily. 1 each 2   gabapentin (NEURONTIN) 100 MG capsule Take 1 capsule (100 mg total) by mouth 3 (three) times daily. 90 capsule 3   mirtazapine (REMERON) 30 MG tablet Take 1 tablet (30 mg total) by mouth at bedtime. 30 tablet 1   sildenafil (VIAGRA) 50 MG tablet Take 1 tablet (50 mg total) by mouth daily as needed for erectile dysfunction. 10 tablet 2   traZODone (DESYREL) 50 MG tablet Take 1 tablet (50 mg total) by mouth at bedtime as needed for sleep. 30 tablet 1   [START ON 07/02/2021] venlafaxine XR (EFFEXOR-XR) 150 MG 24 hr capsule 225 mg daily. Take along with 75 mg cap 30 capsule 2   [START ON 07/02/2021] venlafaxine XR (EFFEXOR-XR) 75 MG 24 hr capsule 225 mg daily. Take along with 150 mg cap 30 capsule 2   amoxicillin-clavulanate (AUGMENTIN) 875-125 MG tablet Take 1 tablet by mouth 2 (two) times daily. 20 tablet 0   prazosin (MINIPRESS) 1 MG capsule Take 1 capsule (1 mg total) by mouth at bedtime for 3 days. 3 capsule 0   prazosin (MINIPRESS) 2 MG capsule 2 mg at night. Start after completing 1 mg at night for 3 days (Patient not taking: Reported on 06/18/2021) 30 capsule 1   No facility-administered medications prior to visit.    Allergies  Allergen Reactions   Other Hives, Itching, Rash and Swelling    ROS Review of Systems  Constitutional:  Negative for chills and fever.  HENT:  Negative for congestion and sore throat.   Eyes:  Negative for pain and discharge.  Respiratory:  Negative for cough and shortness of breath.   Cardiovascular:   Negative for chest pain and palpitations.  Gastrointestinal:  Negative for constipation, diarrhea, nausea and vomiting.  Endocrine: Negative for polydipsia and polyuria.  Genitourinary:  Negative for dysuria and hematuria.  Musculoskeletal:  Positive for back pain. Negative for neck pain and neck stiffness.  Skin:  Positive for rash.  Neurological:  Negative for dizziness, weakness, numbness and headaches.  Psychiatric/Behavioral:  Negative for agitation and behavioral problems.      Objective:    Physical Exam Vitals  reviewed.  Constitutional:      General: He is not in acute distress.    Appearance: He is not diaphoretic.  HENT:     Head: Normocephalic and atraumatic.     Nose: Nose normal.     Mouth/Throat:     Mouth: Mucous membranes are moist.  Eyes:     General: No scleral icterus.    Extraocular Movements: Extraocular movements intact.  Neck:     Vascular: No carotid bruit.  Cardiovascular:     Rate and Rhythm: Normal rate and regular rhythm.     Pulses: Normal pulses.     Heart sounds: Normal heart sounds. No murmur heard. Pulmonary:     Breath sounds: Normal breath sounds. No wheezing or rales.  Abdominal:     Palpations: Abdomen is soft.     Tenderness: There is no abdominal tenderness.  Musculoskeletal:        General: Tenderness (Lumbar spine area) present.     Cervical back: Neck supple. No tenderness.     Right lower leg: No edema.     Left lower leg: No edema.  Skin:    General: Skin is warm.     Findings: Rash (Erythematous patch over right forearm) present.  Neurological:     General: No focal deficit present.     Mental Status: He is alert and oriented to person, place, and time.     Cranial Nerves: No cranial nerve deficit.     Sensory: No sensory deficit.     Motor: No weakness.  Psychiatric:        Mood and Affect: Mood normal.        Behavior: Behavior normal.    BP (!) 150/80 (BP Location: Left Arm, Patient Position: Sitting, Cuff Size:  Normal)   Pulse 69   Temp 98.1 F (36.7 C) (Oral)   Resp 18   Ht 5' 6"  (1.676 m)   Wt 135 lb 0.6 oz (61.3 kg)   SpO2 98%   BMI 21.80 kg/m  Wt Readings from Last 3 Encounters:  06/18/21 135 lb 0.6 oz (61.3 kg)  04/03/21 135 lb (61.2 kg)  02/16/21 139 lb 6.4 oz (63.2 kg)     Health Maintenance Due  Topic Date Due   Pneumococcal Vaccine 49-24 Years old (1 - PCV) Never done   Hepatitis C Screening  Never done   TETANUS/TDAP  Never done   COLONOSCOPY (Pts 45-51yr Insurance coverage will need to be confirmed)  Never done   Zoster Vaccines- Shingrix (1 of 2) Never done   COVID-19 Vaccine (3 - Booster for Moderna series) 02/27/2021   INFLUENZA VACCINE  05/21/2021    There are no preventive care reminders to display for this patient.  Lab Results  Component Value Date   TSH 2.956 11/06/2018   Lab Results  Component Value Date   WBC 14.0 (H) 11/06/2018   HGB 14.7 11/06/2018   HCT 45.6 11/06/2018   MCV 93.8 11/06/2018   PLT 295 11/06/2018   Lab Results  Component Value Date   NA 138 11/06/2018   K 3.8 11/06/2018   CO2 26 11/06/2018   GLUCOSE 93 11/06/2018   BUN 15 11/06/2018   CREATININE 0.90 11/06/2018   ALKPHOS 71 07/10/2018   AST 14 07/10/2018   ALT 9 (A) 07/10/2018   CALCIUM 9.1 11/06/2018   ANIONGAP 8 11/06/2018   Lab Results  Component Value Date   CHOL 108 07/10/2018   Lab Results  Component Value Date  HDL 35 07/10/2018   Lab Results  Component Value Date   LDLCALC 59 07/10/2018   Lab Results  Component Value Date   TRIG 63 07/10/2018   No results found for: [facility] Lab Results  Component Value Date   HGBA1C 5.4 11/16/2018      Assessment & Plan:   Problem List Items Addressed This Visit       Annual physical exam - Primary   Annual exam as documented. Counseling done  re healthy lifestyle involving commitment to 150 minutes exercise per week, heart healthy diet, and attaining healthy weight.The importance of adequate sleep also  discussed. Changes in health habits are decided on by the patient with goals and time frames  set for achieving them. Immunization and cancer screening needs are specifically addressed at this visit.     Relevant Orders  CBC with Differential/Platelet  CMP14+EGFR  Lipid panel  Hemoglobin A1c  TSH    Cardiovascular and Mediastinum   Essential hypertension    BP Readings from Last 1 Encounters:  06/18/21 (!) 150/80  New-onset Started Losartan 25 mg QD Counseled for compliance with the medications Advised DASH diet and moderate exercise/walking, at least 150 mins/week       Relevant Medications   losartan (COZAAR) 25 MG tablet     Nervous and Auditory   Chronic low back pain with bilateral sciatica    Has persistent back pain Check X-ray lumbar spine On Gabapentin 100 mg TID Will refer to PT if worsening symptoms      Relevant Orders   DG Lumbar Spine Complete     Other   Other Visit Diagnoses     Need for hepatitis C screening test       Relevant Orders   Hepatitis C Antibody   Encounter for screening for HIV       Relevant Orders   HIV antibody (with reflex)   Eczema, unspecified type       Relevant Medications   triamcinolone cream (KENALOG) 0.1 %       Meds ordered this encounter  Medications   triamcinolone cream (KENALOG) 0.1 %    Sig: Apply 1 application topically 2 (two) times daily.    Dispense:  30 g    Refill:  0   losartan (COZAAR) 25 MG tablet    Sig: Take 1 tablet (25 mg total) by mouth daily.    Dispense:  90 tablet    Refill:  0    Follow-up: Return in about 3 months (around 09/18/2021) for HTN.    Lindell Spar, MD

## 2021-06-18 NOTE — Assessment & Plan Note (Signed)
BP Readings from Last 1 Encounters:  06/18/21 (!) 150/80   New-onset Started Losartan 25 mg QD Counseled for compliance with the medications Advised DASH diet and moderate exercise/walking, at least 150 mins/week

## 2021-06-19 LAB — CMP14+EGFR
ALT: 13 IU/L (ref 0–44)
AST: 19 IU/L (ref 0–40)
Albumin/Globulin Ratio: 1.1 — ABNORMAL LOW (ref 1.2–2.2)
Albumin: 4 g/dL (ref 3.8–4.9)
Alkaline Phosphatase: 92 IU/L (ref 44–121)
BUN/Creatinine Ratio: 14 (ref 9–20)
BUN: 12 mg/dL (ref 6–24)
Bilirubin Total: 0.2 mg/dL (ref 0.0–1.2)
CO2: 25 mmol/L (ref 20–29)
Calcium: 9.2 mg/dL (ref 8.7–10.2)
Chloride: 98 mmol/L (ref 96–106)
Creatinine, Ser: 0.88 mg/dL (ref 0.76–1.27)
Globulin, Total: 3.5 g/dL (ref 1.5–4.5)
Glucose: 68 mg/dL (ref 65–99)
Potassium: 4.1 mmol/L (ref 3.5–5.2)
Sodium: 139 mmol/L (ref 134–144)
Total Protein: 7.5 g/dL (ref 6.0–8.5)
eGFR: 104 mL/min/{1.73_m2} (ref >59)

## 2021-06-19 LAB — LIPID PANEL
Chol/HDL Ratio: 3.3 ratio (ref 0.0–5.0)
Cholesterol, Total: 131 mg/dL (ref 100–199)
HDL: 40 mg/dL (ref >39)
LDL Chol Calc (NIH): 77 mg/dL (ref 0–99)
Triglycerides: 69 mg/dL (ref 0–149)
VLDL Cholesterol Cal: 14 mg/dL (ref 5–40)

## 2021-06-19 LAB — CBC WITH DIFFERENTIAL/PLATELET
Basophils Absolute: 0.1 10*3/uL (ref 0.0–0.2)
Basos: 1 %
EOS (ABSOLUTE): 0.4 10*3/uL (ref 0.0–0.4)
Eos: 4 %
Hematocrit: 42.1 % (ref 37.5–51.0)
Hemoglobin: 14.3 g/dL (ref 13.0–17.7)
Immature Grans (Abs): 0 10*3/uL (ref 0.0–0.1)
Immature Granulocytes: 0 %
Lymphocytes Absolute: 2.7 10*3/uL (ref 0.7–3.1)
Lymphs: 28 %
MCH: 29.4 pg (ref 26.6–33.0)
MCHC: 34 g/dL (ref 31.5–35.7)
MCV: 87 fL (ref 79–97)
Monocytes Absolute: 0.6 10*3/uL (ref 0.1–0.9)
Monocytes: 7 %
Neutrophils Absolute: 5.9 10*3/uL (ref 1.4–7.0)
Neutrophils: 60 %
Platelets: 297 10*3/uL (ref 150–450)
RBC: 4.86 x10E6/uL (ref 4.14–5.80)
RDW: 12.5 % (ref 11.6–15.4)
WBC: 9.7 10*3/uL (ref 3.4–10.8)

## 2021-06-19 LAB — HEMOGLOBIN A1C
Est. average glucose Bld gHb Est-mCnc: 120 mg/dL
Hgb A1c MFr Bld: 5.8 % — ABNORMAL HIGH (ref 4.8–5.6)

## 2021-06-19 LAB — HEPATITIS C ANTIBODY: Hep C Virus Ab: <0.1 s/co ratio (ref 0.0–0.9)

## 2021-06-19 LAB — TSH: TSH: 1.37 u[IU]/mL (ref 0.450–4.500)

## 2021-06-19 LAB — HIV ANTIBODY (ROUTINE TESTING W REFLEX): HIV Screen 4th Generation wRfx: NONREACTIVE

## 2021-06-26 ENCOUNTER — Telehealth: Payer: Self-pay

## 2021-06-26 NOTE — Telephone Encounter (Signed)
Patient called asking if can be referred to another provider Marylee Floras.

## 2021-06-27 ENCOUNTER — Other Ambulatory Visit: Payer: Self-pay | Admitting: *Deleted

## 2021-06-27 ENCOUNTER — Encounter: Payer: Self-pay | Admitting: Internal Medicine

## 2021-06-27 DIAGNOSIS — Z1211 Encounter for screening for malignant neoplasm of colon: Secondary | ICD-10-CM

## 2021-06-27 NOTE — Telephone Encounter (Signed)
Referral placed please let pt know

## 2021-06-27 NOTE — Telephone Encounter (Signed)
Left voice mail

## 2021-07-10 ENCOUNTER — Other Ambulatory Visit: Payer: Self-pay

## 2021-07-10 ENCOUNTER — Ambulatory Visit (INDEPENDENT_AMBULATORY_CARE_PROVIDER_SITE_OTHER): Payer: Medicare Other | Admitting: Nurse Practitioner

## 2021-07-10 ENCOUNTER — Encounter: Payer: Self-pay | Admitting: Nurse Practitioner

## 2021-07-10 DIAGNOSIS — I10 Essential (primary) hypertension: Secondary | ICD-10-CM | POA: Diagnosis not present

## 2021-07-10 MED ORDER — LOSARTAN POTASSIUM 25 MG PO TABS: 25.0000 mg | ORAL_TABLET | Freq: Every day | ORAL | 0 refills | Status: DC

## 2021-07-10 NOTE — Assessment & Plan Note (Signed)
-  he was recently started on losartan and he is taking prazosin -he states he never got the 2 mg dose of prazosin and has been taking only the 1 mg dose at bedtime -to decrease risk of side effects, he will take losartan at night -if dizziness persists, we may swap losartan to something else

## 2021-07-10 NOTE — Progress Notes (Signed)
Acute Office Visit  Subjective:    Patient ID: Keith Nolan, male    DOB: [birth year], 50 y.o.   MRN: [id number]  Chief Complaint  Patient presents with   Dizziness    Pt says he feels like the medication he was prescribed-Losartan is causing this. He feels dizzy every time he takes it.     Dizziness  Patient is in today for dizziness. He states this happens a couple of hours after he takes the medicine. He states dizziness may last 2 minutes or the rest of the day. His BP has been great when he checks it at home. He states he feels like he is off balance and going to fall when he takes it.  This started after he was prescribed losartan.  Past Medical History:  Diagnosis Date   COPD (chronic obstructive pulmonary disease) ([place])    DJD (degenerative joint disease)    Seasonal allergies    SHOULDER DISLOCATION-RECURRENT 10/25/2010   Qualifier: Diagnosis of  By: Aline Brochure MD, Madelyn Brunner PATELLAR (MALALIGNMENT) 10/25/2010   Qualifier: Diagnosis of  By: Aline Brochure MD, Dorothyann Peng      History reviewed. No pertinent surgical history.  Family History  Problem Relation Age of Onset   Anxiety disorder Mother    Depression Mother    Anxiety disorder Father    Depression Father    Alcohol abuse Father    Alcohol abuse Maternal Grandfather     Social History   Socioeconomic History   Marital status: Single    Spouse name: Not on file   Number of children: Not on file   Years of education: Not on file   Highest education level: Not on file  Occupational History   Not on file  Tobacco Use   Smoking status: Every Day    Packs/day: 1.00    Years: 30.00    Pack years: 30.00    Types: Cigarettes   Smokeless tobacco: Never   Tobacco comments:    provided pt with [place] Quit line information  Vaping Use   Vaping Use: Never used  Substance and Sexual Activity   Alcohol use: No   Drug use: No   Sexual activity: Never  Other Topics Concern   Not on file  Social History  Narrative   Not on file   Social Determinants of Health   Financial Resource Strain: Low Risk    Difficulty of Paying Living Expenses: Not hard at all  Food Insecurity: No Food Insecurity   Worried About Charity fundraiser in the Last Year: Never true   Ran Out of Food in the Last Year: Never true  Transportation Needs: No Transportation Needs   Lack of Transportation (Medical): No   Lack of Transportation (Non-Medical): No  Physical Activity: Insufficiently Active   Days of Exercise per Week: 3 days   Minutes of Exercise per Session: 30 min  Stress: No Stress Concern Present   Feeling of Stress : Only a little  Social Connections: Moderately Isolated   Frequency of Communication with Friends and Family: More than three times a week   Frequency of Social Gatherings with Friends and Family: Never   Attends Religious Services: More than 4 times per year   Active Member of [employer] or Organizations: No   Attends Archivist Meetings: Never   Marital Status: Never married  Human resources officer Violence: Not At Risk   Fear of Current or Ex-Partner: No   Emotionally Abused:  No   Physically Abused: No   Sexually Abused: No    Outpatient Medications Prior to Visit  Medication Sig Dispense Refill   albuterol (VENTOLIN HFA) 108 (90 Base) MCG/ACT inhaler Inhale 2 puffs into the lungs every 6 (six) hours as needed for wheezing or shortness of breath. 8 g 3   cyclobenzaprine (FLEXERIL) 10 MG tablet Take 1 tablet (10 mg total) by mouth 3 (three) times daily as needed for muscle spasms. 30 tablet 0   fluticasone (FLONASE) 50 MCG/ACT nasal spray Place 2 sprays into both nostrils daily. 16 g 6   fluticasone furoate-vilanterol (BREO ELLIPTA) 100-25 MCG/INH AEPB Inhale 1 puff into the lungs daily. 1 each 2   gabapentin (NEURONTIN) 100 MG capsule Take 1 capsule (100 mg total) by mouth 3 (three) times daily. 90 capsule 3   mirtazapine (REMERON) 30 MG tablet Take 1 tablet (30 mg total) by mouth  at bedtime. 30 tablet 1   prazosin (MINIPRESS) 2 MG capsule 2 mg at night. Start after completing 1 mg at night for 3 days 30 capsule 1   sildenafil (VIAGRA) 50 MG tablet Take 1 tablet (50 mg total) by mouth daily as needed for erectile dysfunction. 10 tablet 2   traZODone (DESYREL) 50 MG tablet Take 1 tablet (50 mg total) by mouth at bedtime as needed for sleep. 30 tablet 1   triamcinolone cream (KENALOG) 0.1 % Apply 1 application topically 2 (two) times daily. 30 g 0   venlafaxine XR (EFFEXOR-XR) 150 MG 24 hr capsule 225 mg daily. Take along with 75 mg cap 30 capsule 2   venlafaxine XR (EFFEXOR-XR) 75 MG 24 hr capsule 225 mg daily. Take along with 150 mg cap 30 capsule 2   losartan (COZAAR) 25 MG tablet Take 1 tablet (25 mg total) by mouth daily. 90 tablet 0   prazosin (MINIPRESS) 1 MG capsule Take 1 capsule (1 mg total) by mouth at bedtime for 3 days. 3 capsule 0   No facility-administered medications prior to visit.    Allergies  Allergen Reactions   Other Hives, Itching, Rash and Swelling    Review of Systems  Constitutional: Negative.   Respiratory: Negative.    Cardiovascular: Negative.   Neurological:  Positive for dizziness.      Objective:    Physical Exam  BP 123/66   Pulse (!) 115  Wt Readings from Last 3 Encounters:  06/18/21 135 lb 0.6 oz (61.3 kg)  04/03/21 135 lb (61.2 kg)  02/16/21 139 lb 6.4 oz (63.2 kg)    Health Maintenance Due  Topic Date Due   TETANUS/TDAP  Never done   COLONOSCOPY (Pts 45-60yr Insurance coverage will need to be confirmed)  Never done   Zoster Vaccines- Shingrix (1 of 2) Never done   COVID-19 Vaccine (3 - Booster for Moderna series) 02/27/2021   INFLUENZA VACCINE  05/21/2021    There are no preventive care reminders to display for this patient.   Lab Results  Component Value Date   TSH 1.370 06/18/2021   Lab Results  Component Value Date   WBC 9.7 06/18/2021   HGB 14.3 06/18/2021   HCT 42.1 06/18/2021   MCV 87  06/18/2021   PLT 297 06/18/2021   Lab Results  Component Value Date   NA 139 06/18/2021   K 4.1 06/18/2021   CO2 25 06/18/2021   GLUCOSE 68 06/18/2021   BUN 12 06/18/2021   CREATININE 0.88 06/18/2021   BILITOT 0.2 06/18/2021   ALKPHOS 92  06/18/2021   AST 19 06/18/2021   ALT 13 06/18/2021   PROT 7.5 06/18/2021   ALBUMIN 4.0 06/18/2021   CALCIUM 9.2 06/18/2021   ANIONGAP 8 11/06/2018   EGFR 104 06/18/2021   Lab Results  Component Value Date   CHOL 131 06/18/2021   Lab Results  Component Value Date   HDL 40 06/18/2021   Lab Results  Component Value Date   LDLCALC 77 06/18/2021   Lab Results  Component Value Date   TRIG 69 06/18/2021   Lab Results  Component Value Date   CHOLHDL 3.3 06/18/2021   Lab Results  Component Value Date   HGBA1C 5.8 (H) 06/18/2021       Assessment & Plan:   Problem List Items Addressed This Visit       Cardiovascular and Mediastinum   Essential hypertension    -he was recently started on losartan and he is taking prazosin -he states he never got the 2 mg dose of prazosin and has been taking only the 1 mg dose at bedtime -to decrease risk of side effects, he will take losartan at night -if dizziness persists, we may swap losartan to something else      Relevant Medications   losartan (COZAAR) 25 MG tablet     Meds ordered this encounter  Medications   losartan (COZAAR) 25 MG tablet    Sig: Take 1 tablet (25 mg total) by mouth at bedtime.    Dispense:  90 tablet    Refill:  0   Date:  07/10/2021   Location of Patient: Home Location of Provider: Office Consent was obtain for visit to be over via telehealth. I verified that I am speaking with the correct person using two identifiers.  I connected with  [name] on 07/10/21 via telephone and verified that I am speaking with the correct person using two identifiers.   I discussed the limitations of evaluation and management by telemedicine. The patient expressed  understanding and agreed to proceed.  Time spent: 9 min   Noreene Larsson, NP

## 2021-07-18 ENCOUNTER — Encounter (INDEPENDENT_AMBULATORY_CARE_PROVIDER_SITE_OTHER): Payer: Self-pay

## 2021-07-18 ENCOUNTER — Telehealth (INDEPENDENT_AMBULATORY_CARE_PROVIDER_SITE_OTHER): Payer: Self-pay

## 2021-07-18 ENCOUNTER — Other Ambulatory Visit (INDEPENDENT_AMBULATORY_CARE_PROVIDER_SITE_OTHER): Payer: Self-pay

## 2021-07-18 DIAGNOSIS — Z1211 Encounter for screening for malignant neoplasm of colon: Secondary | ICD-10-CM

## 2021-07-18 MED ORDER — PEG 3350-KCL-NA BICARB-NACL 420 G PO SOLR: 4000.0000 mL | ORAL | 0 refills | Status: DC

## 2021-07-18 NOTE — Telephone Encounter (Signed)
Keith Nolan, CMA  

## 2021-07-18 NOTE — Telephone Encounter (Signed)
CLOSE

## 2021-07-18 NOTE — Telephone Encounter (Signed)
Referring MD/PCP: Posey Pronto  Procedure: Tcs  Reason/Indication:  Screening   Has patient had this procedure before?  no  If so, when, by whom and where?    Is there a family history of colon cancer?  no  Who?  What age when diagnosed?    Is patient diabetic? If yes, Type 1 or Type 2   no      Does patient have prosthetic heart valve or mechanical valve?  no  Do you have a pacemaker/defibrillator?  no  Has patient ever had endocarditis/atrial fibrillation? no  Does patient use oxygen? no  Has patient had joint replacement within last 12 months?  no  Is patient constipated or do they take laxatives? no  Does patient have a history of alcohol/drug use?  no  Have you had a stroke/heart attack last 6 mths? no  Do you take medicine for weight loss?  no  For male patients,: do you still have your menstrual cycle? N/A  Is patient on blood thinner such as Coumadin, Plavix and/or Aspirin? no  Medications: none  Allergies: nkda  Medication Adjustment per Dr Laural Golden none  Procedure date & time: 07/26/21 1:15

## 2021-07-24 ENCOUNTER — Telehealth: Payer: Self-pay | Admitting: Internal Medicine

## 2021-07-24 NOTE — Telephone Encounter (Signed)
Patient called in with concerns for upcomming colonoscopy. Is concerned about sleeping and taking meds. Would like someone to call and clarify what he needs  to do.

## 2021-07-26 ENCOUNTER — Encounter (HOSPITAL_COMMUNITY)
Admission: RE | Disposition: A | Discharge status: Home / Self Care | Payer: Self-pay | Source: Home / Self Care | Attending: Internal Medicine

## 2021-07-26 ENCOUNTER — Other Ambulatory Visit: Payer: Self-pay

## 2021-07-26 ENCOUNTER — Ambulatory Visit (HOSPITAL_COMMUNITY)
Admission: RE | Admit: 2021-07-26 | Discharge: 2021-07-26 | Disposition: A | Discharge status: Home / Self Care | Payer: Medicare Other | Attending: Internal Medicine | Admitting: Internal Medicine

## 2021-07-26 ENCOUNTER — Encounter (HOSPITAL_COMMUNITY): Payer: Self-pay | Admitting: Internal Medicine

## 2021-07-26 DIAGNOSIS — Z1211 Encounter for screening for malignant neoplasm of colon: Secondary | ICD-10-CM | POA: Diagnosis not present

## 2021-07-26 DIAGNOSIS — Z79899 Other long term (current) drug therapy: Secondary | ICD-10-CM | POA: Insufficient documentation

## 2021-07-26 DIAGNOSIS — K648 Other hemorrhoids: Secondary | ICD-10-CM | POA: Insufficient documentation

## 2021-07-26 DIAGNOSIS — Z7951 Long term (current) use of inhaled steroids: Secondary | ICD-10-CM | POA: Insufficient documentation

## 2021-07-26 DIAGNOSIS — F17210 Nicotine dependence, cigarettes, uncomplicated: Secondary | ICD-10-CM | POA: Insufficient documentation

## 2021-07-26 DIAGNOSIS — K644 Residual hemorrhoidal skin tags: Secondary | ICD-10-CM | POA: Diagnosis not present

## 2021-07-26 DIAGNOSIS — K6289 Other specified diseases of anus and rectum: Secondary | ICD-10-CM | POA: Diagnosis not present

## 2021-07-26 DIAGNOSIS — G8929 Other chronic pain: Secondary | ICD-10-CM | POA: Insufficient documentation

## 2021-07-26 DIAGNOSIS — M545 Low back pain, unspecified: Secondary | ICD-10-CM | POA: Insufficient documentation

## 2021-07-26 DIAGNOSIS — D122 Benign neoplasm of ascending colon: Secondary | ICD-10-CM | POA: Diagnosis not present

## 2021-07-26 HISTORY — PX: COLONOSCOPY: SHX5424

## 2021-07-26 HISTORY — PX: POLYPECTOMY: SHX5525

## 2021-07-26 LAB — HM COLONOSCOPY

## 2021-07-26 SURGERY — COLONOSCOPY
Anesthesia: Moderate Sedation

## 2021-07-26 MED ORDER — STERILE WATER FOR IRRIGATION IR SOLN
Status: DC | PRN
  Administered 2021-07-26: 100 mL

## 2021-07-26 MED ORDER — MEPERIDINE HCL 50 MG/ML IJ SOLN
INTRAMUSCULAR | Status: DC | PRN
  Administered 2021-07-26 (×2): 25 mg via INTRAVENOUS

## 2021-07-26 MED ORDER — SODIUM CHLORIDE 0.9 % IV SOLN
INTRAVENOUS | Status: DC
  Administered 2021-07-26: 1000 mL via INTRAVENOUS

## 2021-07-26 MED ORDER — MEPERIDINE HCL 50 MG/ML IJ SOLN
INTRAMUSCULAR | Status: AC
  Filled 2021-07-26: qty 1

## 2021-07-26 MED ORDER — MIDAZOLAM HCL 5 MG/5ML IJ SOLN
INTRAMUSCULAR | Status: DC | PRN
  Administered 2021-07-26 (×4): 2 mg via INTRAVENOUS

## 2021-07-26 MED ORDER — MIDAZOLAM HCL 5 MG/5ML IJ SOLN
INTRAMUSCULAR | Status: AC
  Filled 2021-07-26: qty 10

## 2021-07-26 NOTE — Discharge Instructions (Addendum)
No aspirin or NSAIDs for 24 hours. Resume other medications as before. Resume usual diet. Physician will call with biopsy results.  PATIENT INSTRUCTIONS POST-ANESTHESIA  IMMEDIATELY FOLLOWING SURGERY:  Do not drive or operate machinery for the first twenty four hours after surgery.  Do not make any important decisions for twenty four hours after surgery or while taking narcotic pain medications or sedatives.  If you develop intractable nausea and vomiting or a severe headache please notify your doctor immediately.  FOLLOW-UP:  Please make an appointment with your surgeon as instructed. You do not need to follow up with anesthesia unless specifically instructed to do so.  WOUND CARE INSTRUCTIONS (if applicable):  Keep a dry clean dressing on the anesthesia/puncture wound site if there is drainage.  Once the wound has quit draining you may leave it open to air.  Generally you should leave the bandage intact for twenty four hours unless there is drainage.  If the epidural site drains for more than 36-48 hours please call the anesthesia department.  QUESTIONS?:  Please feel free to call your physician or the hospital operator if you have any questions, and they will be happy to assist you.

## 2021-07-26 NOTE — H&P (Signed)
Keith Nolan is an 50 y.o. male.   Chief Complaint: Patient is here for colonoscopy. HPI: Patient is a 50 year old Caucasian male who is here for screening colonoscopy.  He denies abdominal pain change in bowel habits or rectal bleeding.  This is patient's first exam.  He does not take OTC NSAIDs or aspirin.  He has chronic low back pain which is constant and because of his disability. Family history is negative for CRC.   Past Medical History:  Diagnosis Date   COPD (chronic obstructive pulmonary disease) ([place])    DJD (degenerative joint disease)    Seasonal allergies    SHOULDER DISLOCATION-RECURRENT 10/25/2010   Qualifier: Diagnosis of  By: Aline Brochure MD, Madelyn Brunner PATELLAR (MALALIGNMENT) 10/25/2010   Qualifier: Diagnosis of  By: Aline Brochure MD, Dorothyann Peng      History reviewed. No pertinent surgical history.  Family History  Problem Relation Age of Onset   Anxiety disorder Mother    Depression Mother    Anxiety disorder Father    Depression Father    Alcohol abuse Father    Alcohol abuse Maternal Grandfather    Social History:  reports that he has been smoking cigarettes. He has a 30.00 pack-year smoking history. He has never used smokeless tobacco. He reports that he does not drink alcohol and does not use drugs.  Allergies: No Known Allergies  Medications Prior to Admission  Medication Sig Dispense Refill   polyethylene glycol-electrolytes (TRILYTE) 420 g solution Take 4,000 mLs by mouth as directed. 4000 mL 0   albuterol (VENTOLIN HFA) 108 (90 Base) MCG/ACT inhaler Inhale 2 puffs into the lungs every 6 (six) hours as needed for wheezing or shortness of breath. (Patient not taking: Reported on 07/20/2021) 8 g 3   cyclobenzaprine (FLEXERIL) 10 MG tablet Take 1 tablet (10 mg total) by mouth 3 (three) times daily as needed for muscle spasms. (Patient not taking: Reported on 07/20/2021) 30 tablet 0   fluticasone (FLONASE) 50 MCG/ACT nasal spray Place 2 sprays into both  nostrils daily. (Patient not taking: Reported on 07/20/2021) 16 g 6   fluticasone furoate-vilanterol (BREO ELLIPTA) 100-25 MCG/INH AEPB Inhale 1 puff into the lungs daily. (Patient not taking: No sig reported) 1 each 2   gabapentin (NEURONTIN) 100 MG capsule Take 1 capsule (100 mg total) by mouth 3 (three) times daily. (Patient not taking: No sig reported) 90 capsule 3   losartan (COZAAR) 25 MG tablet Take 1 tablet (25 mg total) by mouth at bedtime. (Patient not taking: No sig reported) 90 tablet 0   mirtazapine (REMERON) 30 MG tablet Take 1 tablet (30 mg total) by mouth at bedtime. (Patient not taking: No sig reported) 30 tablet 1   prazosin (MINIPRESS) 1 MG capsule Take 1 capsule (1 mg total) by mouth at bedtime for 3 days. (Patient not taking: Reported on 07/20/2021) 3 capsule 0   prazosin (MINIPRESS) 2 MG capsule 2 mg at night. Start after completing 1 mg at night for 3 days (Patient not taking: No sig reported) 30 capsule 1   sildenafil (VIAGRA) 50 MG tablet Take 1 tablet (50 mg total) by mouth daily as needed for erectile dysfunction. (Patient not taking: No sig reported) 10 tablet 2   traZODone (DESYREL) 50 MG tablet Take 1 tablet (50 mg total) by mouth at bedtime as needed for sleep. (Patient not taking: No sig reported) 30 tablet 1   triamcinolone cream (KENALOG) 0.1 % Apply 1 application topically 2 (two) times daily. (  Patient not taking: No sig reported) 30 g 0   venlafaxine XR (EFFEXOR-XR) 150 MG 24 hr capsule 225 mg daily. Take along with 75 mg cap (Patient not taking: No sig reported) 30 capsule 2   venlafaxine XR (EFFEXOR-XR) 75 MG 24 hr capsule 225 mg daily. Take along with 150 mg cap (Patient not taking: No sig reported) 30 capsule 2    No results found for this or any previous visit (from the past 48 hour(s)). No results found.  Review of Systems  Blood pressure (!) 156/77, pulse 77, temperature 98.6 F (37 C), temperature source Oral, resp. rate 18, height 5\' 6"  (1.676 m), weight  59.9 kg, SpO2 100 %. Physical Exam HENT:     Mouth/Throat:     Mouth: Mucous membranes are moist.     Pharynx: Oropharynx is clear.  Eyes:     General: No scleral icterus.    Conjunctiva/sclera: Conjunctivae normal.  Cardiovascular:     Rate and Rhythm: Normal rate and regular rhythm.     Heart sounds: Normal heart sounds. No murmur heard. Pulmonary:     Effort: Pulmonary effort is normal.     Breath sounds: Normal breath sounds.  Abdominal:     General: There is no distension.     Palpations: Abdomen is soft. There is no mass.     Tenderness: There is no abdominal tenderness.  Musculoskeletal:        General: No swelling.     Cervical back: Neck supple.  Lymphadenopathy:     Cervical: No cervical adenopathy.  Skin:    General: Skin is warm and dry.  Neurological:     Mental Status: He is alert.     Assessment/Plan  Average risk screening colonoscopy  Hildred Laser, MD 07/26/2021, 2:14 PM

## 2021-07-26 NOTE — Op Note (Signed)
[facility] Patient Name: Arch Methot Procedure Date: 07/26/2021 2:02 PM MRN: [id number] Date of Birth: [birth year] Attending MD: Hildred Laser , MD CSN: [id number] Age: 50 Admit Type: Outpatient Procedure:                Colonoscopy Indications:              Screening for colorectal malignant neoplasm Providers:                Hildred Laser, MD, Caprice Kluver, Raphael Gibney,                            Technician Referring MD:             Hurshel Party, MD Medicines:                Meperidine 50 mg IV, Midazolam 8 mg IV Complications:            No immediate complications. Estimated Blood Loss:     Estimated blood loss was minimal. Procedure:                Pre-Anesthesia Assessment:                           - Prior to the procedure, a History and Physical                            was performed, and patient medications and                            allergies were reviewed. The patient's tolerance of                            previous anesthesia was also reviewed. The risks                            and benefits of the procedure and the sedation                            options and risks were discussed with the patient.                            All questions were answered, and informed consent                            was obtained. Prior Anticoagulants: The patient has                            taken no previous anticoagulant or antiplatelet                            agents. ASA Grade Assessment: II - A patient with                            mild systemic disease. After reviewing the risks  and benefits, the patient was deemed in                            satisfactory condition to undergo the procedure.                           After obtaining informed consent, the colonoscope                            was passed under direct vision. Throughout the                            procedure, the patient's blood pressure, pulse, and                             oxygen saturations were monitored continuously. The                            [id number]-HQ190L ([id number]) scope was introduced through                            the anus and advanced to the the cecum, identified                            by appendiceal orifice and ileocecal valve. The                            colonoscopy was performed without difficulty. The                            patient tolerated the procedure well. The quality                            of the bowel preparation was excellent. The                            ileocecal valve, appendiceal orifice, and rectum                            were photographed. Scope In: 2:27:06 PM Scope Out: 2:41:43 PM Scope Withdrawal Time: 0 hours 9 minutes 38 seconds  Total Procedure Duration: 0 hours 14 minutes 37 seconds  Findings:      The perianal and digital rectal examinations were normal.      A 5 mm polyp was found in the mid ascending colon. The polyp was       sessile. The polyp was removed with a cold snare. Resection and       retrieval were complete.      The exam was otherwise normal throughout the examined colon.      External and internal hemorrhoids were found during retroflexion. The       hemorrhoids were small.      Anal papilla was hypertrophied. Impression:               - One 5 mm polyp in the mid ascending colon,  removed with a cold snare. Resected and retrieved.                           - External and internal hemorrhoids.                           - Small anal papilla. Moderate Sedation:      Moderate (conscious) sedation was administered by the endoscopy nurse       and supervised by the endoscopist. The following parameters were       monitored: oxygen saturation, heart rate, blood pressure, CO2       capnography and response to care. Total physician intraservice time was       20 minutes. Recommendation:           - Patient has a contact number available for                             emergencies. The signs and symptoms of potential                            delayed complications were discussed with the                            patient. Return to normal activities tomorrow.                            Written discharge instructions were provided to the                            patient.                           - Resume previous diet today.                           - Continue present medications.                           - No aspirin, ibuprofen, naproxen, or other                            non-steroidal anti-inflammatory drugs for 1 day.                           - Await pathology results.                           - Repeat colonoscopy is recommended. The                            colonoscopy date will be determined after pathology                            results from today's exam become available for  review. Procedure Code(s):        --- Professional ---                           [phone], Colonoscopy, flexible; with removal of                            tumor(s), polyp(s), or other lesion(s) by snare                            technique                           G0500, Moderate sedation services provided by the                            same physician or other qualified health care                            professional performing a gastrointestinal                            endoscopic service that sedation supports,                            requiring the presence of an independent trained                            observer to assist in the monitoring of the                            patient's level of consciousness and physiological                            status; initial 15 minutes of intra-service time;                            patient age 91 years or older (additional time may                            be reported with [phone], as appropriate) Diagnosis Code(s):        --- Professional ---                            Z12.11, Encounter for screening for malignant                            neoplasm of colon                           K63.5, Polyp of colon                           K62.89, Other specified diseases of anus and rectum  K64.8, Other hemorrhoids CPT copyright 2019 American Medical Association. All rights reserved. The codes documented in this report are preliminary and upon coder review may  be revised to meet current compliance requirements. Hildred Laser, MD Hildred Laser, MD 07/26/2021 2:48:13 PM This report has been signed electronically. Number of Addenda: 0

## 2021-07-27 ENCOUNTER — Telehealth (INDEPENDENT_AMBULATORY_CARE_PROVIDER_SITE_OTHER): Payer: Self-pay | Admitting: *Deleted

## 2021-07-27 NOTE — Telephone Encounter (Signed)
Pt called and left vm that he didn't remember what dr Laural Golden told him yesterday after his colonoscopy.   Left message to return call with pt.

## 2021-07-30 LAB — SURGICAL PATHOLOGY

## 2021-08-01 ENCOUNTER — Encounter (HOSPITAL_COMMUNITY): Payer: Self-pay | Admitting: Internal Medicine
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
## 2021-08-03 ENCOUNTER — Encounter (INDEPENDENT_AMBULATORY_CARE_PROVIDER_SITE_OTHER): Payer: Self-pay | Admitting: *Deleted

## 2021-08-06 ENCOUNTER — Ambulatory Visit: Payer: Medicare Other | Admitting: Psychiatry

## 2021-08-20 ENCOUNTER — Encounter: Payer: Self-pay | Admitting: *Deleted

## 2021-08-23 ENCOUNTER — Institutional Professional Consult (permissible substitution): Payer: Medicare Other | Admitting: Neurology

## 2021-08-23 DIAGNOSIS — H905 Unspecified sensorineural hearing loss: Secondary | ICD-10-CM | POA: Diagnosis not present

## 2021-08-24 ENCOUNTER — Encounter: Payer: Self-pay | Admitting: Neurology

## 2021-09-11 ENCOUNTER — Other Ambulatory Visit: Payer: Self-pay | Admitting: Internal Medicine

## 2021-09-11 DIAGNOSIS — I10 Essential (primary) hypertension: Secondary | ICD-10-CM

## 2021-09-18 ENCOUNTER — Encounter: Payer: Self-pay | Admitting: Internal Medicine

## 2021-09-18 ENCOUNTER — Telehealth: Payer: Self-pay | Admitting: Internal Medicine

## 2021-09-18 ENCOUNTER — Telehealth: Payer: Self-pay | Admitting: Psychiatry

## 2021-09-18 ENCOUNTER — Ambulatory Visit (INDEPENDENT_AMBULATORY_CARE_PROVIDER_SITE_OTHER): Payer: Medicare Other | Admitting: Internal Medicine

## 2021-09-18 ENCOUNTER — Other Ambulatory Visit: Payer: Self-pay

## 2021-09-18 VITALS — BP 138/78 | HR 90 | Resp 18 | Ht 66.0 in | Wt 133.1 lb

## 2021-09-18 DIAGNOSIS — F17210 Nicotine dependence, cigarettes, uncomplicated: Secondary | ICD-10-CM | POA: Diagnosis not present

## 2021-09-18 DIAGNOSIS — M5442 Lumbago with sciatica, left side: Secondary | ICD-10-CM

## 2021-09-18 DIAGNOSIS — M5441 Lumbago with sciatica, right side: Secondary | ICD-10-CM | POA: Diagnosis not present

## 2021-09-18 DIAGNOSIS — G8929 Other chronic pain: Secondary | ICD-10-CM | POA: Diagnosis not present

## 2021-09-18 DIAGNOSIS — I10 Essential (primary) hypertension: Secondary | ICD-10-CM | POA: Diagnosis not present

## 2021-09-18 DIAGNOSIS — Z720 Tobacco use: Secondary | ICD-10-CM

## 2021-09-18 DIAGNOSIS — D126 Benign neoplasm of colon, unspecified: Secondary | ICD-10-CM

## 2021-09-18 DIAGNOSIS — M1710 Unilateral primary osteoarthritis, unspecified knee: Secondary | ICD-10-CM

## 2021-09-18 MED ORDER — LOSARTAN POTASSIUM 25 MG PO TABS
25.0000 mg | ORAL_TABLET | Freq: Every day | ORAL | 1 refills | Status: DC
Start: 2021-09-18 — End: 2021-12-05

## 2021-09-18 MED ORDER — CYCLOBENZAPRINE HCL 10 MG PO TABS: 10.0000 mg | ORAL_TABLET | Freq: Two times a day (BID) | ORAL | 0 refills | Status: DC | PRN

## 2021-09-18 MED ORDER — NAPROXEN 500 MG PO TABS: 500.0000 mg | ORAL_TABLET | Freq: Every day | ORAL | 2 refills | Status: DC

## 2021-09-18 NOTE — Assessment & Plan Note (Signed)
BP Readings from Last 1 Encounters:  09/18/21 138/78   Well-controlled with Losartan 25 mg QD Counseled for compliance with the medications Advised DASH diet and moderate exercise/walking, at least 150 mins/week
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
## 2021-09-18 NOTE — Patient Instructions (Signed)
Please continue taking medications as prescribed.  Please get X-ray of lumbar spine done at [facility].

## 2021-09-18 NOTE — Telephone Encounter (Signed)
Sent Dr Modesta Messing  a msg, as the pt has not been able to reach her

## 2021-09-18 NOTE — Progress Notes (Signed)
Established Patient Office Visit  Subjective:  Patient ID: Keith Nolan, male    DOB: 04-28-1971  Age: 50 y.o. MRN: [id number]  CC:  Chief Complaint  Patient presents with   Follow-up    3 month follow up HTN is still having lower back pain wasn't able to get xrays done     HPI Keith Nolan is a 50 y.o. male with past medical history of asthma with COPD, chronic low back pain, chronic opioid medication use in the past, anxiety with PTSD and tobacco abuse who  presents for f/u of his chronic medical conditions.  HTN: BP is well-controlled. Takes medications regularly. Patient denies headache, dizziness, chest pain, dyspnea or palpitations.  Chronic low back pain: He has been taking gabapentin, which has not been helping much.  He has not had x-ray of the lumbar spine done yet, which was ordered in the last visit.  Denies any numbness, tingling or weakness of the LE.  Denies any recent injury or fall.  Past Medical History:  Diagnosis Date   COPD (chronic obstructive pulmonary disease) ([place])    DJD (degenerative joint disease)    Seasonal allergies    SHOULDER DISLOCATION-RECURRENT 10/25/2010   Qualifier: Diagnosis of  By: Aline Brochure MD, Madelyn Brunner PATELLAR (MALALIGNMENT) 10/25/2010   Qualifier: Diagnosis of  By: Aline Brochure MD, Dorothyann Peng      Past Surgical History:  Procedure Laterality Date   COLONOSCOPY N/A 07/26/2021   Procedure: COLONOSCOPY;  Surgeon: Rogene Houston, MD;  [facility];  Service: Endoscopy;  Laterality: N/A;  1:15   POLYPECTOMY  07/26/2021   Procedure: POLYPECTOMY;  Surgeon: Rogene Houston, MD;  [facility];  Service: Endoscopy;;    Family History  Problem Relation Age of Onset   Anxiety disorder Mother    Depression Mother    Anxiety disorder Father    Depression Father    Alcohol abuse Father    Alcohol abuse Maternal Grandfather     Social History   Socioeconomic History   Marital status: Single    Spouse  name: Not on file   Number of children: Not on file   Years of education: Not on file   Highest education level: Not on file  Occupational History   Not on file  Tobacco Use   Smoking status: Every Day    Packs/day: 1.00    Years: 30.00    Pack years: 30.00    Types: Cigarettes   Smokeless tobacco: Never   Tobacco comments:    provided pt with [place] Quit line information  Vaping Use   Vaping Use: Never used  Substance and Sexual Activity   Alcohol use: No   Drug use: No   Sexual activity: Never  Other Topics Concern   Not on file  Social History Narrative   Not on file   Social Determinants of Health   Financial Resource Strain: Low Risk    Difficulty of Paying Living Expenses: Not hard at all  Food Insecurity: No Food Insecurity   Worried About Charity fundraiser in the Last Year: Never true   Ran Out of Food in the Last Year: Never true  Transportation Needs: No Transportation Needs   Lack of Transportation (Medical): No   Lack of Transportation (Non-Medical): No  Physical Activity: Insufficiently Active   Days of Exercise per Week: 3 days   Minutes of Exercise per Session: 30 min  Stress: No Stress Concern  Present   Feeling of Stress : Only a little  Social Connections: Moderately Isolated   Frequency of Communication with Friends and Family: More than three times a week   Frequency of Social Gatherings with Friends and Family: Never   Attends Religious Services: More than 4 times per year   Active Member of [employer] or Organizations: No   Attends Music therapist: Never   Marital Status: Never married  Human resources officer Violence: Not At Risk   Fear of Current or Ex-Partner: No   Emotionally Abused: No   Physically Abused: No   Sexually Abused: No    Outpatient Medications Prior to Visit  Medication Sig Dispense Refill   albuterol (VENTOLIN HFA) 108 (90 Base) MCG/ACT inhaler Inhale 2 puffs into the lungs every 6 (six) hours as needed for wheezing or  shortness of breath. 8 g 3   fluticasone (FLONASE) 50 MCG/ACT nasal spray Place 2 sprays into both nostrils daily. 16 g 6   fluticasone furoate-vilanterol (BREO ELLIPTA) 100-25 MCG/INH AEPB Inhale 1 puff into the lungs daily. 1 each 2   gabapentin (NEURONTIN) 100 MG capsule Take 1 capsule (100 mg total) by mouth 3 (three) times daily. 90 capsule 3   prazosin (MINIPRESS) 2 MG capsule 2 mg at night. Start after completing 1 mg at night for 3 days 30 capsule 1   cyclobenzaprine (FLEXERIL) 10 MG tablet Take 1 tablet (10 mg total) by mouth 3 (three) times daily as needed for muscle spasms. 30 tablet 0   No facility-administered medications prior to visit.    No Known Allergies  ROS Review of Systems  Constitutional:  Negative for chills and fever.  HENT:  Negative for congestion and sore throat.   Eyes:  Negative for pain and discharge.  Respiratory:  Negative for cough and shortness of breath.   Cardiovascular:  Negative for chest pain and palpitations.  Gastrointestinal:  Negative for constipation, diarrhea, nausea and vomiting.  Endocrine: Negative for polydipsia and polyuria.  Genitourinary:  Negative for dysuria and hematuria.  Musculoskeletal:  Positive for back pain. Negative for neck pain and neck stiffness.  Skin:  Positive for rash.  Neurological:  Negative for dizziness, weakness, numbness and headaches.  Psychiatric/Behavioral:  Negative for agitation and behavioral problems.      Objective:    Physical Exam Vitals reviewed.  Constitutional:      General: He is not in acute distress.    Appearance: He is not diaphoretic.  HENT:     Head: Normocephalic and atraumatic.     Nose: Nose normal.     Mouth/Throat:     Mouth: Mucous membranes are moist.  Eyes:     General: No scleral icterus.    Extraocular Movements: Extraocular movements intact.  Neck:     Vascular: No carotid bruit.  Cardiovascular:     Rate and Rhythm: Normal rate and regular rhythm.     Pulses:  Normal pulses.     Heart sounds: Normal heart sounds. No murmur heard. Pulmonary:     Breath sounds: Normal breath sounds. No wheezing or rales.  Abdominal:     Palpations: Abdomen is soft.     Tenderness: There is no abdominal tenderness.  Musculoskeletal:        General: Tenderness (Lumbar spine area) present.     Cervical back: Neck supple. No tenderness.     Right lower leg: No edema.     Left lower leg: No edema.  Skin:    General:  Skin is warm.     Findings: Rash (Erythematous patch over right forearm) present.  Neurological:     General: No focal deficit present.     Mental Status: He is alert and oriented to person, place, and time.     Cranial Nerves: No cranial nerve deficit.     Sensory: No sensory deficit.     Motor: No weakness.  Psychiatric:        Mood and Affect: Mood normal.        Behavior: Behavior normal.    BP 138/78 (BP Location: Left Arm, Patient Position: Sitting, Cuff Size: Normal)   Pulse 90   Resp 18   Ht 5' 6"  (1.676 m)   Wt 133 lb 1.9 oz (60.4 kg)   SpO2 98%   BMI 21.49 kg/m  Wt Readings from Last 3 Encounters:  09/18/21 133 lb 1.9 oz (60.4 kg)  07/26/21 132 lb (59.9 kg)  06/18/21 135 lb 0.6 oz (61.3 kg)    Lab Results  Component Value Date   TSH 1.370 06/18/2021   Lab Results  Component Value Date   WBC 9.7 06/18/2021   HGB 14.3 06/18/2021   HCT 42.1 06/18/2021   MCV 87 06/18/2021   PLT 297 06/18/2021   Lab Results  Component Value Date   NA 139 06/18/2021   K 4.1 06/18/2021   CO2 25 06/18/2021   GLUCOSE 68 06/18/2021   BUN 12 06/18/2021   CREATININE 0.88 06/18/2021   BILITOT 0.2 06/18/2021   ALKPHOS 92 06/18/2021   AST 19 06/18/2021   ALT 13 06/18/2021   PROT 7.5 06/18/2021   ALBUMIN 4.0 06/18/2021   CALCIUM 9.2 06/18/2021   ANIONGAP 8 11/06/2018   EGFR 104 06/18/2021   Lab Results  Component Value Date   CHOL 131 06/18/2021   Lab Results  Component Value Date   HDL 40 06/18/2021   Lab Results  Component  Value Date   LDLCALC 77 06/18/2021   Lab Results  Component Value Date   TRIG 69 06/18/2021   Lab Results  Component Value Date   CHOLHDL 3.3 06/18/2021   Lab Results  Component Value Date   HGBA1C 5.8 (H) 06/18/2021      Assessment & Plan:   Problem List Items Addressed This Visit       Cardiovascular and Mediastinum   Essential hypertension - Primary    BP Readings from Last 1 Encounters:  09/18/21 138/78  Well-controlled with Losartan 25 mg QD Counseled for compliance with the medications Advised DASH diet and moderate exercise/walking, at least 150 mins/week       Relevant Medications   losartan (COZAAR) 25 MG tablet     Digestive   Tubular adenoma of colon    Last colonoscopy in 2022, repeat in 5 years        Nervous and Auditory   Chronic low back pain with bilateral sciatica    Has persistent back pain Check X-ray lumbar spine On Gabapentin 100 mg TID Naproxen and Flexeril PRN      Relevant Medications   cyclobenzaprine (FLEXERIL) 10 MG tablet   naproxen (NAPROSYN) 500 MG tablet     Other   Tobacco abuse    Smokes about 3-4 cigarettes/day, has been trying to cut down.  Asked about quitting: confirms that he is currently smokes cigarettes Advise to quit smoking: Educated about QUITTING to reduce the risk of cancer, cardio and cerebrovascular disease. Assess willingness: Unwilling to quit at this time, but  is working on cutting back. Assist with counseling and pharmacotherapy: Counseled for 5 minutes and literature provided. Arrange for follow up: follow up in 3 months and continue to offer help.      Other Visit Diagnoses     Arthritis of knee       Relevant Medications   cyclobenzaprine (FLEXERIL) 10 MG tablet   naproxen (NAPROSYN) 500 MG tablet       Meds ordered this encounter  Medications   cyclobenzaprine (FLEXERIL) 10 MG tablet    Sig: Take 1 tablet (10 mg total) by mouth 2 (two) times daily as needed for muscle spasms.     Dispense:  30 tablet    Refill:  0   naproxen (NAPROSYN) 500 MG tablet    Sig: Take 1 tablet (500 mg total) by mouth daily.    Dispense:  30 tablet    Refill:  2   losartan (COZAAR) 25 MG tablet    Sig: Take 1 tablet (25 mg total) by mouth daily.    Dispense:  90 tablet    Refill:  1    Follow-up: Return in about 6 months (around 03/18/2022).    Lindell Spar, MD

## 2021-09-18 NOTE — Telephone Encounter (Signed)
Sent Dr Manual Meier a msg via teams

## 2021-09-18 NOTE — Telephone Encounter (Addendum)
Received a mail from May Betsy that the patient has not heard from this clinician in a couple of months.  According to the chart, he no showed to the last appointment.  Will relay this message to the front desk to have a follow up appointment.

## 2021-09-20 ENCOUNTER — Telehealth: Payer: Self-pay

## 2021-09-20 ENCOUNTER — Other Ambulatory Visit: Payer: Self-pay

## 2021-09-20 ENCOUNTER — Encounter: Payer: Self-pay | Admitting: Psychiatry

## 2021-09-20 ENCOUNTER — Telehealth (INDEPENDENT_AMBULATORY_CARE_PROVIDER_SITE_OTHER): Payer: Medicare Other | Admitting: Psychiatry

## 2021-09-20 DIAGNOSIS — F4310 Post-traumatic stress disorder, unspecified: Secondary | ICD-10-CM

## 2021-09-20 DIAGNOSIS — G4700 Insomnia, unspecified: Secondary | ICD-10-CM | POA: Diagnosis not present

## 2021-09-20 DIAGNOSIS — F331 Major depressive disorder, recurrent, moderate: Secondary | ICD-10-CM

## 2021-09-20 MED ORDER — VENLAFAXINE HCL ER 75 MG PO CP24: 225.0000 mg | ORAL_CAPSULE | Freq: Every day | ORAL | 1 refills | Status: DC

## 2021-09-20 MED ORDER — MIRTAZAPINE 30 MG PO TABS: 30.0000 mg | ORAL_TABLET | Freq: Every day | ORAL | 1 refills | Status: DC

## 2021-09-20 MED ORDER — PRAZOSIN HCL 2 MG PO CAPS: 2.0000 mg | ORAL_CAPSULE | Freq: Every day | ORAL | 1 refills | Status: DC

## 2021-09-20 MED ORDER — TRAZODONE HCL 50 MG PO TABS: ORAL_TABLET | ORAL | 2 refills | Status: DC

## 2021-09-20 NOTE — Assessment & Plan Note (Signed)
Last colonoscopy in 2022, repeat in 5 years

## 2021-09-20 NOTE — Assessment & Plan Note (Addendum)
Has persistent back pain Check X-ray lumbar spine On Gabapentin 100 mg TID Naproxen and Flexeril PRN

## 2021-09-20 NOTE — Telephone Encounter (Signed)
Medication management - Telephone call with Cari Caraway, pharmacist at pt's [facility] to review last refill of medications. Pt. last filled Venlafaxine 150 mg and 75 mg on 06/18/21, Mirtazapine 30 mg 06/18/21, Prazosin 2mg  06/18/21, and Trazodone 50 mg 07/22/21. Patient has 1 refill left of each for Venlafaxine orders, Mirtazapine, and Prazosin.  No refills remaining for Trazodone.

## 2021-09-20 NOTE — Assessment & Plan Note (Signed)
Smokes about 3-4 cigarettes/day, has been trying to cut down.  Asked about quitting: confirms that he is currently smokes cigarettes Advise to quit smoking: Educated about QUITTING to reduce the risk of cancer, cardio and cerebrovascular disease. Assess willingness: Unwilling to quit at this time, but is working on cutting back. Assist with counseling and pharmacotherapy: Counseled for 5 minutes and literature provided. Arrange for follow up: follow up in 3 months and continue to offer help.

## 2021-09-20 NOTE — Patient Instructions (Addendum)
Continue venlafaxine 225 mg daily  Increase mirtazapine 30 mg at night  Continue trazodone 25-50 mg at night as needed for sleep Next appointment: 2/6 at 11 AM, in person  The next visit will be in person visit. Please arrive 15 mins before the scheduled time.   [facility]  Address: [address], [place], [place] [address]

## 2021-09-25 ENCOUNTER — Telehealth: Payer: Self-pay

## 2021-09-25 ENCOUNTER — Other Ambulatory Visit: Payer: Self-pay | Admitting: Psychiatry

## 2021-09-25 MED ORDER — PRAZOSIN HCL 2 MG PO CAPS
2.0000 mg | ORAL_CAPSULE | Freq: Every day | ORAL | 0 refills | Status: DC
Start: 2021-09-25 — End: 2022-01-21

## 2021-09-25 NOTE — Telephone Encounter (Signed)
received fax request for a 90 day supply fo the prazosin 2mg 

## 2021-09-25 NOTE — Telephone Encounter (Signed)
Ordered. Please contact the pharmacy, and cancel any other refills of prazosin.

## 2021-09-28 NOTE — Telephone Encounter (Signed)
Pharmacy was notified.

## 2021-11-06 ENCOUNTER — Encounter: Payer: Self-pay | Admitting: *Deleted

## 2021-11-06 DIAGNOSIS — Z2821 Immunization not carried out because of patient refusal: Secondary | ICD-10-CM | POA: Insufficient documentation

## 2021-11-07 ENCOUNTER — Ambulatory Visit: Payer: Medicare Other | Admitting: Gastroenterology

## 2021-11-08 ENCOUNTER — Telehealth: Payer: Self-pay | Admitting: Internal Medicine

## 2021-11-08 ENCOUNTER — Other Ambulatory Visit: Payer: Self-pay | Admitting: *Deleted

## 2021-11-08 DIAGNOSIS — R7303 Prediabetes: Secondary | ICD-10-CM

## 2021-11-08 MED ORDER — BLOOD GLUCOSE METER KIT
PACK | 0 refills | Status: DC
Start: 2021-11-08 — End: 2021-11-09

## 2021-11-08 NOTE — Telephone Encounter (Signed)
Will await prescription request and change pharmacy in system

## 2021-11-08 NOTE — Telephone Encounter (Signed)
Patient informed. 

## 2021-11-08 NOTE — Telephone Encounter (Signed)
Pt wants to know if can get blood glucose meter , sugar levels drop randomly    Pt also wants to get authorization to [place] for refills so  he can get automatic refills   WHOLE MED LIST needs refills      Pt wants to speak with nurse / provider in regards

## 2021-11-08 NOTE — Telephone Encounter (Signed)
Patient called says need to know what name of meter is for this [employer]. Please call patient at [phone]

## 2021-11-08 NOTE — Telephone Encounter (Signed)
Please have him come by when able they usually lose the ones we fax it would be better if he can pick up when able

## 2021-11-08 NOTE — Telephone Encounter (Signed)
Pt called back in stating that he is unable to pick up prescription from office    Wanted to know if it can be sent in to [facility] on [address]    Jeannene Patella to last teles

## 2021-11-08 NOTE — Telephone Encounter (Signed)
Per last tele message prescription is up front at front desk for pt pick up can fax to either pharmacy but it would be better if patient will pick up at the office.

## 2021-11-08 NOTE — Telephone Encounter (Signed)
Per last tele   Pt will be using [facility]   Fax * [phone]  Call back [phone]

## 2021-11-08 NOTE — Telephone Encounter (Signed)
Patient called said the pharmacy has not received rx on meter kit or supplies for test strips at either pharmacy [facility] [address] or [facility] at 3:30 pm on 11/08/2021.

## 2021-11-08 NOTE — Telephone Encounter (Signed)
Ordered blood glucose kit awaiting to hear from what pharmacy as he did not know either will let us know  °

## 2021-11-09 ENCOUNTER — Other Ambulatory Visit: Payer: Self-pay

## 2021-11-09 ENCOUNTER — Telehealth: Payer: Self-pay

## 2021-11-09 ENCOUNTER — Telehealth: Payer: Self-pay | Admitting: Internal Medicine

## 2021-11-09 DIAGNOSIS — R7303 Prediabetes: Secondary | ICD-10-CM

## 2021-11-09 MED ORDER — BLOOD GLUCOSE METER KIT: PACK | 0 refills | Status: DC

## 2021-11-09 NOTE — Telephone Encounter (Signed)
Juanda Crumble called from [employer], [phone],   Patient called needs prescription acu chek guide for test strip and meter and also auc chek soft clix lancet to be to [facility] by fax [phone] or electronically.

## 2021-11-09 NOTE — Telephone Encounter (Signed)
Pt called back in regard last set of tele messages    Pt needs diabetic supply prescription sent [facility] with [facility]   Fax [phone]   Call back [phone]   Can call pt for more info if needed

## 2021-11-09 NOTE — Telephone Encounter (Signed)
Supplies sent to [facility]

## 2021-11-09 NOTE — Telephone Encounter (Signed)
Rx sent by fax to [facility]

## 2021-11-12 ENCOUNTER — Other Ambulatory Visit: Payer: Self-pay | Admitting: Psychiatry

## 2021-11-12 ENCOUNTER — Other Ambulatory Visit: Payer: Self-pay | Admitting: Internal Medicine

## 2021-11-12 DIAGNOSIS — G8929 Other chronic pain: Secondary | ICD-10-CM

## 2021-11-16 ENCOUNTER — Ambulatory Visit: Payer: Medicare Other | Admitting: Nurse Practitioner

## 2021-11-16 ENCOUNTER — Other Ambulatory Visit: Payer: Self-pay | Admitting: Psychiatry
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
## 2021-11-26 ENCOUNTER — Ambulatory Visit: Payer: Medicare Other | Admitting: Psychiatry

## 2021-12-05 ENCOUNTER — Other Ambulatory Visit: Payer: Self-pay | Admitting: Psychiatry

## 2021-12-05 ENCOUNTER — Other Ambulatory Visit: Payer: Self-pay | Admitting: Internal Medicine

## 2021-12-05 DIAGNOSIS — I10 Essential (primary) hypertension: Secondary | ICD-10-CM

## 2021-12-19 ENCOUNTER — Ambulatory Visit: Payer: Medicare Other | Admitting: Psychiatry

## 2022-01-10 ENCOUNTER — Other Ambulatory Visit: Payer: Self-pay | Admitting: Psychiatry

## 2022-01-10 ENCOUNTER — Telehealth: Payer: Self-pay | Admitting: Psychiatry

## 2022-01-10 NOTE — Telephone Encounter (Signed)
Ordered refill of prazosin per request. Please contact the patient to make follow up appointment (for 30 mins). I will not be able to prescribe any more refills without evaluation.Please also remind him of attendance policy.  ?

## 2022-01-10 NOTE — Telephone Encounter (Signed)
front desk left message for patient to call office back to set up an appt.  ?

## 2022-01-10 NOTE — Telephone Encounter (Signed)
pt was mailed a letter to call office to set up an appt.  ?

## 2022-01-10 NOTE — Telephone Encounter (Signed)
LVM to sch °

## 2022-01-21 ENCOUNTER — Telehealth (INDEPENDENT_AMBULATORY_CARE_PROVIDER_SITE_OTHER): Payer: Medicare Other | Admitting: Psychiatry

## 2022-01-21 ENCOUNTER — Encounter: Payer: Self-pay | Admitting: Psychiatry

## 2022-01-21 DIAGNOSIS — F330 Major depressive disorder, recurrent, mild: Secondary | ICD-10-CM | POA: Diagnosis not present

## 2022-01-21 DIAGNOSIS — F4310 Post-traumatic stress disorder, unspecified: Secondary | ICD-10-CM

## 2022-01-21 DIAGNOSIS — G4700 Insomnia, unspecified: Secondary | ICD-10-CM

## 2022-01-21 MED ORDER — MIRTAZAPINE 30 MG PO TABS: 30.0000 mg | ORAL_TABLET | Freq: Every day | ORAL | 0 refills | Status: DC

## 2022-01-21 MED ORDER — TRAZODONE HCL 50 MG PO TABS: 50.0000 mg | ORAL_TABLET | Freq: Every evening | ORAL | 0 refills | Status: DC | PRN

## 2022-01-21 MED ORDER — VENLAFAXINE HCL ER 75 MG PO CP24: 225.0000 mg | ORAL_CAPSULE | Freq: Every day | ORAL | 0 refills | Status: DC

## 2022-01-21 NOTE — Patient Instructions (Signed)
Continue venlafaxine 225 mg daily  ?Continue mirtazapine 30 mg at night  ?Continue trazodone 25-50 mg at night as needed for sleep ?Next appointment: 6/27 at 11 AM, video ?
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
## 2022-02-08 ENCOUNTER — Other Ambulatory Visit: Payer: Self-pay | Admitting: Internal Medicine

## 2022-02-08 DIAGNOSIS — M1710 Unilateral primary osteoarthritis, unspecified knee: Secondary | ICD-10-CM

## 2022-02-08 MED ORDER — BLOOD GLUCOSE METER KIT
PACK | 0 refills | Status: DC
Start: 2022-02-08 — End: 2022-10-01

## 2022-02-08 MED ORDER — CYCLOBENZAPRINE HCL 10 MG PO TABS: 10.0000 mg | ORAL_TABLET | Freq: Two times a day (BID) | ORAL | 0 refills | Status: DC | PRN

## 2022-03-17 ENCOUNTER — Other Ambulatory Visit: Payer: Self-pay | Admitting: Psychiatry

## 2022-03-21 ENCOUNTER — Ambulatory Visit: Payer: Medicare Other | Admitting: Internal Medicine

## 2022-03-27 ENCOUNTER — Other Ambulatory Visit: Payer: Self-pay | Admitting: Psychiatry

## 2022-03-30 ENCOUNTER — Other Ambulatory Visit: Payer: Self-pay | Admitting: Psychiatry

## 2022-04-04 ENCOUNTER — Ambulatory Visit: Payer: Medicare Other

## 2022-04-08 ENCOUNTER — Ambulatory Visit (INDEPENDENT_AMBULATORY_CARE_PROVIDER_SITE_OTHER): Payer: Medicare Other

## 2022-04-08 DIAGNOSIS — Z0000 Encounter for general adult medical examination without abnormal findings: Secondary | ICD-10-CM | POA: Diagnosis not present

## 2022-04-08 NOTE — Progress Notes (Signed)
Subjective:   Keith Nolan is a 51 y.o. male who presents for Medicare Annual/Subsequent preventive examination. I connected with  [name] on 04/08/22 by a audio enabled telemedicine application and verified that I am speaking with the correct person using two identifiers.  Patient Location: Home  Provider Location: Office/Clinic  I discussed the limitations of evaluation and management by telemedicine. The patient expressed understanding and agreed to proceed.  Review of Systems           Objective:    There were no vitals filed for this visit. There is no height or weight on file to calculate BMI.     07/26/2021   12:21 PM 04/03/2021    1:16 PM 12/16/2019    5:54 PM 11/21/2019    3:49 PM 04/07/2019    9:45 PM 12/07/2018    3:54 PM 11/06/2018    7:02 PM  Advanced Directives  Does Patient Have a Medical Advance Directive? _0  No No  Would patient like information on creating a medical advance directive? No - Patient declined No - Patient declined No - Patient declined No - Patient declined       Current Medications (verified) Outpatient Encounter Medications as of 04/08/2022  Medication Sig   ACCU-CHEK GUIDE test strip CHECK BLOOD SUGAR 4 TIMES DAILY   Accu-Chek Softclix Lancets lancets CHECK BLOOD SUGAR 4 TIMES DAILY   albuterol (VENTOLIN HFA) 108 (90 Base) MCG/ACT inhaler Inhale 2 puffs into the lungs every 6 (six) hours as needed for wheezing or shortness of breath.   blood glucose meter kit and supplies Dispense based on patient and insurance preference.Testing four times daily DX E11.9   cyclobenzaprine (FLEXERIL) 10 MG tablet Take 1 tablet (10 mg total) by mouth 2 (two) times daily as needed for muscle spasms.   fluticasone (FLONASE) 50 MCG/ACT nasal spray Place 2 sprays into both nostrils daily.   fluticasone furoate-vilanterol (BREO ELLIPTA) 100-25 MCG/INH AEPB Inhale 1 puff into the lungs daily.   gabapentin (NEURONTIN) 100 MG capsule Take 1  capsule (100 mg total) by mouth 3 (three) times daily.   losartan (COZAAR) 25 MG tablet TAKE 1 TABLET BY MOUTH DAILY   mirtazapine (REMERON) 30 MG tablet Take 1 tablet (30 mg total) by mouth at bedtime.   naproxen (NAPROSYN) 500 MG tablet TAKE 1 TABLET BY MOUTH DAILY   traZODone (DESYREL) 50 MG tablet Take 1 tablet (50 mg total) by mouth at bedtime as needed for sleep. 25-50 mg at night as needed for sleep   venlafaxine XR (EFFEXOR-XR) 75 MG 24 hr capsule Take 3 capsules (225 mg total) by mouth daily with breakfast.   No facility-administered encounter medications on file as of 04/08/2022.    Allergies (verified) Patient has no known allergies.   History: Past Medical History:  Diagnosis Date   COPD (chronic obstructive pulmonary disease) ([place])    DJD (degenerative joint disease)    Seasonal allergies    SHOULDER DISLOCATION-RECURRENT 10/25/2010   Qualifier: Diagnosis of  By: Aline Brochure MD, Madelyn Brunner PATELLAR (MALALIGNMENT) 10/25/2010   Qualifier: Diagnosis of  By: Aline Brochure MD, Dorothyann Peng     Past Surgical History:  Procedure Laterality Date   COLONOSCOPY N/A 07/26/2021   Procedure: COLONOSCOPY;  Surgeon: Rogene Houston, MD;  [facility];  Service: Endoscopy;  Laterality: N/A;  1:15   POLYPECTOMY  07/26/2021   Procedure: POLYPECTOMY;  Surgeon: Rogene Houston, MD;  Location: [facility];  Service: Endoscopy;;   Family History  Problem Relation Age of Onset   Anxiety disorder Mother    Depression Mother    Anxiety disorder Father    Depression Father    Alcohol abuse Father    Alcohol abuse Maternal Grandfather    Social History   Socioeconomic History   Marital status: Single    Spouse name: Not on file   Number of children: Not on file   Years of education: Not on file   Highest education level: Not on file  Occupational History   Not on file  Tobacco Use   Smoking status: Every Day    Packs/day: 1.00    Years: 30.00    Total pack years: 30.00     Types: Cigarettes   Smokeless tobacco: Never   Tobacco comments:    provided pt with [place] Quit line information  Vaping Use   Vaping Use: Never used  Substance and Sexual Activity   Alcohol use: No   Drug use: No   Sexual activity: Never  Other Topics Concern   Not on file  Social History Narrative   Not on file   Social Determinants of Health   Financial Resource Strain: Low Risk  (04/03/2021)   Overall Financial Resource Strain (CARDIA)    Difficulty of Paying Living Expenses: Not hard at all  Food Insecurity: No Food Insecurity (04/03/2021)   Hunger Vital Sign    Worried About Running Out of Food in the Last Year: Never true    Ran Out of Food in the Last Year: Never true  Transportation Needs: No Transportation Needs (04/03/2021)   PRAPARE - Hydrologist (Medical): No    Lack of Transportation (Non-Medical): No  Physical Activity: Insufficiently Active (04/03/2021)   Exercise Vital Sign    Days of Exercise per Week: 3 days    Minutes of Exercise per Session: 30 min  Stress: No Stress Concern Present (04/03/2021)   [place]    Feeling of Stress : Only a little  Social Connections: Moderately Isolated (04/03/2021)   Social Connection and Isolation Panel [NHANES]    Frequency of Communication with Friends and Family: More than three times a week    Frequency of Social Gatherings with Friends and Family: Never    Attends Religious Services: More than 4 times per year    Active Member of [employer] or Organizations: No    Attends Music therapist: Never    Marital Status: Never married    Tobacco Counseling Ready to quit: Not Answered Counseling given: Not Answered Tobacco comments: provided pt with [place] Quit line information   Clinical Intake:                 Diabetic?no         Activities of Daily Living     No data to display           Patient Care Team: Lindell Spar, MD as PCP - General (Internal Medicine)  Indicate any recent Medical Services you may have received from other than [facility] providers in the past year (date may be approximate).     Assessment:   This is a routine wellness examination for Keith Nolan.  Hearing/Vision screen No results found.  Dietary issues and exercise activities discussed:     Goals Addressed   None    Depression Screen    01/21/2022  10:10 AM 09/18/2021    2:19 PM 07/10/2021    3:37 PM 06/18/2021    2:15 PM 04/03/2021    1:15 PM 03/13/2021   11:11 AM 02/16/2021   11:27 AM  PHQ 2/9 Scores  PHQ - 2 Score  0 0 3 1  0  PHQ- 9 Score   1 9        Information is confidential and restricted. Go to Review Flowsheets to unlock data.    Fall Risk    09/18/2021    2:19 PM 07/10/2021    3:36 PM 06/18/2021    2:15 PM 02/16/2021   11:27 AM 11/28/2020   10:08 AM  Fall Risk   Falls in the past year? 0 0 0 0 0  Number falls in past yr: 0 0 0 0 0  Injury with Fall? 0 0 0 0 0  Risk for fall due to : _0   Follow up _1     FALL RISK PREVENTION PERTAINING TO THE HOME:  Any stairs in or around the home? Yes  If so, are there any without handrails? No  Home free of loose throw rugs in walkways, pet beds, electrical cords, etc? Yes  Adequate lighting in your home to reduce risk of falls? Yes   ASSISTIVE DEVICES UTILIZED TO PREVENT FALLS:  Life alert? No  Use of a cane, walker or w/c? No  Grab bars in the bathroom? Yes  Shower chair or bench in shower? No  Elevated toilet seat or a handicapped toilet? Yes   TIMED UP AND GO:  Was the test performed? No .  Length of time to ambulate 10 feet: sec.     Cognitive Function:        04/03/2021    1:18 PM  6CIT Screen  What Year? 0 points   What month? 0 points  What time? 0 points  Count back from 20 0 points  Months in reverse 0 points  Repeat phrase 0 points  Total Score 0 points    Immunizations Immunization History  Administered Date(s) Administered   Influenza-Unspecified 08/17/2014, 09/14/2018   Moderna Sars-Covid-2 Vaccination 08/30/2020, 09/29/2020    TDAP status: Due, Education has been provided regarding the importance of this vaccine. Advised may receive this vaccine at local pharmacy or Health Dept. Aware to provide a copy of the vaccination record if obtained from local pharmacy or Health Dept. Verbalized acceptance and understanding.  Flu Vaccine status: Up to date  Pneumococcal vaccine status: Up to date  Covid-19 vaccine status: Information provided on how to obtain vaccines.   Qualifies for Shingles Vaccine? Yes   Zostavax completed No   Shingrix Completed?: No.    Education has been provided regarding the importance of this vaccine. Patient has been advised to call insurance company to determine out of pocket expense if they have not yet received this vaccine. Advised may also receive vaccine at local pharmacy or Health Dept. Verbalized acceptance and understanding.  Screening Tests Health Maintenance  Topic Date Due   TETANUS/TDAP  Never done   Zoster Vaccines- Shingrix (1 of 2) Never done   COVID-19 Vaccine (3 - Moderna series) 11/24/2020   INFLUENZA VACCINE  05/21/2022   COLONOSCOPY (Pts 45-23yr Insurance coverage will need to be confirmed)  07/26/2026   Hepatitis C Screening  Completed   HIV Screening  Completed   HPV VACCINES  Aged Out    Health Maintenance  Health Maintenance Due  Topic Date Due   TETANUS/TDAP  Never done   Zoster Vaccines- Shingrix (1 of 2) Never done   COVID-19 Vaccine (3 - Moderna series) 11/24/2020    Colorectal cancer screening: Type of screening: Colonoscopy. Completed 07/26/21. Repeat every 5 years  Lung Cancer Screening: (Low Dose CT Chest recommended  if Age 64-80 years, 30 pack-year currently smoking OR have quit w/in 15years.) does qualify.   Lung Cancer Screening Referral:   Additional Screening:  Hepatitis C Screening: does not qualify; Completed 06/18/21  Vision Screening: Recommended annual ophthalmology exams for early detection of glaucoma and other disorders of the eye. Is the patient up to date with their annual eye exam?  No  Who is the provider or what is the name of the office in which the patient attends annual eye exams? [facility] If pt is not established with a provider, would they like to be referred to a provider to establish care? No .   Dental Screening: Recommended annual dental exams for proper oral hygiene  Community Resource Referral / Chronic Care Management: CRR required this visit?  No   CCM required this visit?  No      Plan:     I have personally reviewed and noted the following in the patient's chart:   Medical and social history Use of alcohol, tobacco or illicit drugs  Current medications and supplements including opioid prescriptions. Patient is not currently taking opioid prescriptions. Functional ability and status Nutritional status Physical activity Advanced directives List of other physicians Hospitalizations, surgeries, and ER visits in previous 12 months Vitals Screenings to include cognitive, depression, and falls Referrals and appointments  In addition, I have reviewed and discussed with patient certain preventive protocols, quality metrics, and best practice recommendations. A written personalized care plan for preventive services as well as general preventive health recommendations were provided to patient.     Jill Side, [place]   04/08/2022   Nurse Notes:

## 2022-04-08 NOTE — Patient Instructions (Signed)
  Keith Nolan , Thank you for taking time to come for your Medicare Wellness Visit. I appreciate your ongoing commitment to your health goals. Please review the following plan we discussed and let me know if I can assist you in the future.   These are the goals we discussed:  Goals      Patient Stated     Patient states that his goal is to feel good and great      Quit Smoking        This is a list of the screening recommended for you and due dates:  Health Maintenance  Topic Date Due   Tetanus Vaccine  Never done   Zoster (Shingles) Vaccine (1 of 2) Never done   COVID-19 Vaccine (3 - Moderna series) 11/24/2020   Flu Shot  05/21/2022   Colon Cancer Screening  07/26/2026   Hepatitis C Screening: USPSTF Recommendation to screen - Ages 18-79 yo.  Completed   HIV Screening  Completed   HPV Vaccine  Aged Out
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
## 2022-04-16 ENCOUNTER — Telehealth (INDEPENDENT_AMBULATORY_CARE_PROVIDER_SITE_OTHER): Payer: Medicare Other | Admitting: Psychiatry

## 2022-04-16 ENCOUNTER — Encounter: Payer: Self-pay | Admitting: Psychiatry

## 2022-04-16 ENCOUNTER — Telehealth: Payer: Self-pay | Admitting: Internal Medicine

## 2022-04-16 DIAGNOSIS — G4700 Insomnia, unspecified: Secondary | ICD-10-CM | POA: Diagnosis not present

## 2022-04-16 DIAGNOSIS — F4310 Post-traumatic stress disorder, unspecified: Secondary | ICD-10-CM | POA: Diagnosis not present

## 2022-04-16 DIAGNOSIS — F330 Major depressive disorder, recurrent, mild: Secondary | ICD-10-CM

## 2022-04-16 MED ORDER — VENLAFAXINE HCL ER 75 MG PO CP24: 225.0000 mg | ORAL_CAPSULE | Freq: Every day | ORAL | 0 refills | Status: DC

## 2022-04-16 MED ORDER — TRAZODONE HCL 50 MG PO TABS: 50.0000 mg | ORAL_TABLET | Freq: Every evening | ORAL | 0 refills | Status: DC | PRN

## 2022-04-16 MED ORDER — MIRTAZAPINE 30 MG PO TABS: 30.0000 mg | ORAL_TABLET | Freq: Every day | ORAL | 0 refills | Status: DC

## 2022-04-24 ENCOUNTER — Encounter: Payer: Self-pay | Admitting: Orthopedic Surgery

## 2022-04-24 ENCOUNTER — Encounter: Payer: Self-pay | Admitting: Internal Medicine

## 2022-04-24 ENCOUNTER — Telehealth: Payer: Self-pay | Admitting: Internal Medicine

## 2022-04-24 ENCOUNTER — Ambulatory Visit (INDEPENDENT_AMBULATORY_CARE_PROVIDER_SITE_OTHER): Payer: Medicare Other | Admitting: Internal Medicine

## 2022-04-24 ENCOUNTER — Other Ambulatory Visit: Payer: Self-pay | Admitting: *Deleted

## 2022-04-24 VITALS — BP 118/68 | HR 74 | Resp 18 | Ht 66.0 in | Wt 132.2 lb

## 2022-04-24 DIAGNOSIS — M25522 Pain in left elbow: Secondary | ICD-10-CM | POA: Insufficient documentation

## 2022-04-24 DIAGNOSIS — Z720 Tobacco use: Secondary | ICD-10-CM | POA: Diagnosis not present

## 2022-04-24 DIAGNOSIS — M79672 Pain in left foot: Secondary | ICD-10-CM

## 2022-04-24 DIAGNOSIS — F17210 Nicotine dependence, cigarettes, uncomplicated: Secondary | ICD-10-CM | POA: Diagnosis not present

## 2022-04-24 DIAGNOSIS — G8929 Other chronic pain: Secondary | ICD-10-CM | POA: Diagnosis not present

## 2022-04-24 DIAGNOSIS — I10 Essential (primary) hypertension: Secondary | ICD-10-CM

## 2022-04-24 DIAGNOSIS — J449 Chronic obstructive pulmonary disease, unspecified: Secondary | ICD-10-CM

## 2022-04-24 MED ORDER — LOSARTAN POTASSIUM 25 MG PO TABS: 25.0000 mg | ORAL_TABLET | Freq: Every day | ORAL | 3 refills | Status: DC

## 2022-04-24 MED ORDER — ACCU-CHEK GUIDE VI STRP: ORAL_STRIP | 2 refills | Status: DC

## 2022-04-24 MED ORDER — ACCU-CHEK SOFTCLIX LANCETS MISC: 2 refills | Status: DC

## 2022-04-24 MED ORDER — ALBUTEROL SULFATE HFA 108 (90 BASE) MCG/ACT IN AERS: 2.0000 | INHALATION_SPRAY | Freq: Four times a day (QID) | RESPIRATORY_TRACT | 3 refills | Status: DC | PRN

## 2022-04-24 MED ORDER — TRAMADOL HCL 50 MG PO TABS: 50.0000 mg | ORAL_TABLET | Freq: Three times a day (TID) | ORAL | 0 refills | Status: DC | PRN

## 2022-04-24 MED ORDER — FLUTICASONE FUROATE-VILANTEROL 100-25 MCG/ACT IN AEPB: 1.0000 | INHALATION_SPRAY | Freq: Every day | RESPIRATORY_TRACT | 11 refills | Status: DC

## 2022-04-24 NOTE — Assessment & Plan Note (Signed)
Could be due to arthritis and/or bursitis Gout cannot be ruled out as well Continue naproxen for mild-to-moderate pain Tramadol for severe pain Referred to orthopedic surgery

## 2022-04-24 NOTE — Assessment & Plan Note (Signed)
Near Achilles tendon area Reports history of bone spurs Naproxen as needed for now

## 2022-04-24 NOTE — Telephone Encounter (Signed)
Pt states that he is needing the Accu-Chek Softclix Lancets lancets sent to [facility]. He longer goes to [facility]. Can you please resend?   Accu-Chek Softclix Lancets lancets    [facility]

## 2022-04-24 NOTE — Assessment & Plan Note (Signed)
BP Readings from Last 1 Encounters:  04/24/22 118/68   Well-controlled with Losartan 25 mg QD Counseled for compliance with the medications Advised DASH diet and moderate exercise/walking, at least 150 mins/week

## 2022-04-24 NOTE — Progress Notes (Signed)
Established Patient Office Visit  Subjective:  Patient ID: DODD SCHMID, male    DOB: 1971-03-02  Age: 51 y.o. MRN: [id number]  CC:  Chief Complaint  Patient presents with   Follow-up    Follow up pt has been having locking elbows since 04-10-22 he cannot extend them they have been hurting and then will pop and hurt worse     HPI Keith Nolan is a 51 y.o. male with past medical history of asthma with COPD, chronic low back pain, chronic opioid medication use in the past, anxiety with PTSD and tobacco abuse who presents for f/u of his chronic medical conditions.  He complains of left elbow pain X 2 weeks, which is constant, worse with extension and hears popping sound upon extension.  Denies any recent injury.  He reports heavy lifting and strenuous work in the past.  He has been taking naproxen with minimal relief.  Denies any numbness or tingling of the UE.  He also complains of left heel pain near Achilles tendon and has limping due to the pain.  Denies any sharp injury recently.  He reports history of bone spurs on the feet.  HTN: BP is well-controlled. Takes medications regularly. Patient denies headache, dizziness, chest pain, dyspnea or palpitations.        Past Medical History:  Diagnosis Date   COPD (chronic obstructive pulmonary disease) ([place])    DJD (degenerative joint disease)    Seasonal allergies    SHOULDER DISLOCATION-RECURRENT 10/25/2010   Qualifier: Diagnosis of  By: Aline Brochure MD, Madelyn Brunner PATELLAR (MALALIGNMENT) 10/25/2010   Qualifier: Diagnosis of  By: Aline Brochure MD, Dorothyann Peng      Past Surgical History:  Procedure Laterality Date   COLONOSCOPY N/A 07/26/2021   Procedure: COLONOSCOPY;  Surgeon: Rogene Houston, MD;  [facility];  Service: Endoscopy;  Laterality: N/A;  1:15   POLYPECTOMY  07/26/2021   Procedure: POLYPECTOMY;  Surgeon: Rogene Houston, MD;  Location: [facility];  Service: Endoscopy;;    Family History   Problem Relation Age of Onset   Anxiety disorder Mother    Depression Mother    Anxiety disorder Father    Depression Father    Alcohol abuse Father    Alcohol abuse Maternal Grandfather     Social History   Socioeconomic History   Marital status: Single    Spouse name: Not on file   Number of children: Not on file   Years of education: Not on file   Highest education level: Not on file  Occupational History   Not on file  Tobacco Use   Smoking status: Every Day    Packs/day: 1.00    Years: 30.00    Total pack years: 30.00    Types: Cigarettes   Smokeless tobacco: Never   Tobacco comments:    provided pt with [place] Quit line information  Vaping Use   Vaping Use: Never used  Substance and Sexual Activity   Alcohol use: No   Drug use: No   Sexual activity: Never  Other Topics Concern   Not on file  Social History Narrative   Not on file   Social Determinants of Health   Financial Resource Strain: Low Risk  (04/03/2021)   Overall Financial Resource Strain (CARDIA)    Difficulty of Paying Living Expenses: Not hard at all  Food Insecurity: No Food Insecurity (04/03/2021)   Hunger Vital Sign    Worried About Running Out  of Food in the Last Year: Never true    [place] in the Last Year: Never true  Transportation Needs: Unknown (04/08/2022)   PRAPARE - Hydrologist (Medical): No    Lack of Transportation (Non-Medical): Not on file  Physical Activity: Insufficiently Active (04/03/2021)   Exercise Vital Sign    Days of Exercise per Week: 3 days    Minutes of Exercise per Session: 30 min  Stress: No Stress Concern Present (04/03/2021)   [place]    Feeling of Stress : Only a little  Social Connections: Moderately Isolated (04/03/2021)   Social Connection and Isolation Panel [NHANES]    Frequency of Communication with Friends and Family: More than three times a week     Frequency of Social Gatherings with Friends and Family: Never    Attends Religious Services: More than 4 times per year    Active Member of [employer] or Organizations: No    Attends Archivist Meetings: Never    Marital Status: Never married  Intimate Partner Violence: Not At Risk (04/03/2021)   Humiliation, Afraid, Rape, and Kick questionnaire    Fear of Current or Ex-Partner: No    Emotionally Abused: No    Physically Abused: No    Sexually Abused: No    Outpatient Medications Prior to Visit  Medication Sig Dispense Refill   blood glucose meter kit and supplies Dispense based on patient and insurance preference.Testing four times daily DX E11.9 1 each 0   cyclobenzaprine (FLEXERIL) 10 MG tablet Take 1 tablet (10 mg total) by mouth 2 (two) times daily as needed for muscle spasms. 30 tablet 0   fluticasone (FLONASE) 50 MCG/ACT nasal spray Place 2 sprays into both nostrils daily. 16 g 6   mirtazapine (REMERON) 30 MG tablet Take 1 tablet (30 mg total) by mouth at bedtime. 90 tablet 0   naproxen (NAPROSYN) 500 MG tablet TAKE 1 TABLET BY MOUTH DAILY 60 tablet 5   traZODone (DESYREL) 50 MG tablet Take 1 tablet (50 mg total) by mouth at bedtime as needed for sleep. 25-50 mg at night as needed for sleep 90 tablet 0   venlafaxine XR (EFFEXOR-XR) 75 MG 24 hr capsule Take 3 capsules (225 mg total) by mouth daily with breakfast. 270 capsule 0   ACCU-CHEK GUIDE test strip CHECK BLOOD SUGAR 4 TIMES DAILY 100 strip 2   Accu-Chek Softclix Lancets lancets CHECK BLOOD SUGAR 4 TIMES DAILY 100 each 2   albuterol (VENTOLIN HFA) 108 (90 Base) MCG/ACT inhaler Inhale 2 puffs into the lungs every 6 (six) hours as needed for wheezing or shortness of breath. 8 g 3   fluticasone furoate-vilanterol (BREO ELLIPTA) 100-25 MCG/INH AEPB Inhale 1 puff into the lungs daily. 1 each 2   losartan (COZAAR) 25 MG tablet TAKE 1 TABLET BY MOUTH DAILY 90 tablet 3   gabapentin (NEURONTIN) 100 MG capsule Take 1 capsule (100 mg  total) by mouth 3 (three) times daily. 90 capsule 3   No facility-administered medications prior to visit.    No Known Allergies  ROS Review of Systems  Constitutional:  Negative for chills and fever.  HENT:  Negative for congestion and sore throat.   Eyes:  Negative for pain and discharge.  Respiratory:  Negative for cough and shortness of breath.   Cardiovascular:  Negative for chest pain and palpitations.  Gastrointestinal:  Negative for diarrhea, nausea and vomiting.  Endocrine:  Negative for polydipsia and polyuria.  Genitourinary:  Negative for dysuria and hematuria.  Musculoskeletal:  Positive for arthralgias (Left elbow) and back pain. Negative for neck pain and neck stiffness.  Neurological:  Negative for dizziness, weakness, numbness and headaches.  Psychiatric/Behavioral:  Negative for agitation and behavioral problems.       Objective:    Physical Exam Vitals reviewed.  Constitutional:      General: He is not in acute distress.    Appearance: He is not diaphoretic.  HENT:     Head: Normocephalic and atraumatic.     Nose: Nose normal.     Mouth/Throat:     Mouth: Mucous membranes are moist.  Eyes:     General: No scleral icterus.    Extraocular Movements: Extraocular movements intact.  Neck:     Vascular: No carotid bruit.  Cardiovascular:     Rate and Rhythm: Normal rate and regular rhythm.     Pulses: Normal pulses.     Heart sounds: Normal heart sounds. No murmur heard. Pulmonary:     Breath sounds: Normal breath sounds. No wheezing or rales.  Musculoskeletal:        General: Tenderness (With mild swelling over left elbow) present.     Cervical back: Neck supple. No tenderness.     Right lower leg: No edema.     Left lower leg: No edema.  Skin:    General: Skin is warm.     Findings: No rash.  Neurological:     General: No focal deficit present.     Mental Status: He is alert and oriented to person, place, and time.     Sensory: No sensory  deficit.     Motor: No weakness.  Psychiatric:        Mood and Affect: Mood normal.        Behavior: Behavior normal.     BP 118/68 (BP Location: Right Arm, Patient Position: Sitting, Cuff Size: Normal)   Pulse 74   Resp 18   Ht _0  (1.676 m)   Wt 132 lb 3.2 oz (60 kg)   SpO2 96%   BMI 21.34 kg/m  Wt Readings from Last 3 Encounters:  04/24/22 132 lb 3.2 oz (60 kg)  09/18/21 133 lb 1.9 oz (60.4 kg)  07/26/21 132 lb (59.9 kg)    Lab Results  Component Value Date   TSH 1.370 06/18/2021   Lab Results  Component Value Date   WBC 9.7 06/18/2021   HGB 14.3 06/18/2021   HCT 42.1 06/18/2021   MCV 87 06/18/2021   PLT 297 06/18/2021   Lab Results  Component Value Date   NA 139 06/18/2021   K 4.1 06/18/2021   CO2 25 06/18/2021   GLUCOSE 68 06/18/2021   BUN 12 06/18/2021   CREATININE 0.88 06/18/2021   BILITOT 0.2 06/18/2021   ALKPHOS 92 06/18/2021   AST 19 06/18/2021   ALT 13 06/18/2021   PROT 7.5 06/18/2021   ALBUMIN 4.0 06/18/2021   CALCIUM 9.2 06/18/2021   ANIONGAP 8 11/06/2018   EGFR 104 06/18/2021   Lab Results  Component Value Date   CHOL 131 06/18/2021   Lab Results  Component Value Date   HDL 40 06/18/2021   Lab Results  Component Value Date   LDLCALC 77 06/18/2021   Lab Results  Component Value Date   TRIG 69 06/18/2021   Lab Results  Component Value Date   CHOLHDL 3.3 06/18/2021   Lab Results  Component  Value Date   HGBA1C 5.8 (H) 06/18/2021      Assessment & Plan:   Problem List Items Addressed This Visit       Cardiovascular and Mediastinum   Essential hypertension    BP Readings from Last 1 Encounters:  04/24/22 118/68  Well-controlled with Losartan 25 mg QD Counseled for compliance with the medications Advised DASH diet and moderate exercise/walking, at least 150 mins/week      Relevant Medications   losartan (COZAAR) 25 MG tablet     Respiratory   Asthma with COPD (chronic obstructive pulmonary disease) ([facility])     Albuterol PRN Needs to use Breo regularly Continues to smoke, has been cutting down.      Relevant Medications   albuterol (VENTOLIN HFA) 108 (90 Base) MCG/ACT inhaler   fluticasone furoate-vilanterol (BREO ELLIPTA) 100-25 MCG/ACT AEPB     Other   Tobacco abuse    Smokes about 7-8 cigarettes/day, has been trying to cut down.  Asked about quitting: confirms that he is currently smokes cigarettes Advise to quit smoking: Educated about QUITTING to reduce the risk of cancer, cardio and cerebrovascular disease. Assess willingness: Unwilling to quit at this time, but is working on cutting back. Assist with counseling and pharmacotherapy: Counseled for 5 minutes and literature provided. Arrange for follow up: follow up in 3 months and continue to offer help.      Left elbow pain - Primary    Could be due to arthritis and/or bursitis Gout cannot be ruled out as well Continue naproxen for mild-to-moderate pain Tramadol for severe pain Referred to orthopedic surgery      Relevant Medications   traMADol (ULTRAM) 50 MG tablet   Other Relevant Orders   Ambulatory referral to Orthopedic Surgery   DG Elbow Complete Left   Chronic heel pain    Near Achilles tendon area Reports history of bone spurs Naproxen as needed for now      Relevant Medications   traMADol (ULTRAM) 50 MG tablet    Meds ordered this encounter  Medications   albuterol (VENTOLIN HFA) 108 (90 Base) MCG/ACT inhaler    Sig: Inhale 2 puffs into the lungs every 6 (six) hours as needed for wheezing or shortness of breath.    Dispense:  8 g    Refill:  3   fluticasone furoate-vilanterol (BREO ELLIPTA) 100-25 MCG/ACT AEPB    Sig: Inhale 1 puff into the lungs daily.    Dispense:  1 each    Refill:  11   losartan (COZAAR) 25 MG tablet    Sig: Take 1 tablet (25 mg total) by mouth daily.    Dispense:  90 tablet    Refill:  3    Requesting 1 year supply   traMADol (ULTRAM) 50 MG tablet    Sig: Take 1 tablet (50 mg  total) by mouth every 8 (eight) hours as needed.    Dispense:  20 tablet    Refill:  0    Follow-up: Return in about 4 months (around 08/25/2022) for Annual physical.    Lindell Spar, MD

## 2022-04-24 NOTE — Telephone Encounter (Signed)
Medication sent to pharmacy  

## 2022-04-24 NOTE — Assessment & Plan Note (Signed)
Smokes about 7-8 cigarettes/day, has been trying to cut down.  Asked about quitting: confirms that he is currently smokes cigarettes Advise to quit smoking: Educated about QUITTING to reduce the risk of cancer, cardio and cerebrovascular disease. Assess willingness: Unwilling to quit at this time, but is working on cutting back. Assist with counseling and pharmacotherapy: Counseled for 5 minutes and literature provided. Arrange for follow up: follow up in 3 months and continue to offer help.

## 2022-04-24 NOTE — Assessment & Plan Note (Addendum)
Albuterol PRN Needs to use Breo regularly Continues to smoke, has been cutting down.

## 2022-04-24 NOTE — Patient Instructions (Signed)
Please take Tramadol as needed for severe pain only.  You are being referred to Orthopedic surgery.  Please continue taking other medications as prescribed.

## 2022-04-26 ENCOUNTER — Other Ambulatory Visit: Payer: Self-pay | Admitting: Internal Medicine

## 2022-04-26 DIAGNOSIS — J449 Chronic obstructive pulmonary disease, unspecified: Secondary | ICD-10-CM

## 2022-04-26 MED ORDER — ALBUTEROL SULFATE HFA 108 (90 BASE) MCG/ACT IN AERS: 2.0000 | INHALATION_SPRAY | Freq: Four times a day (QID) | RESPIRATORY_TRACT | 5 refills | Status: DC | PRN

## 2022-05-02 ENCOUNTER — Telehealth: Payer: Self-pay | Admitting: Internal Medicine

## 2022-05-02 ENCOUNTER — Other Ambulatory Visit: Payer: Self-pay | Admitting: *Deleted

## 2022-05-02 MED ORDER — ACCU-CHEK GUIDE VI STRP: ORAL_STRIP | 2 refills | Status: DC

## 2022-05-02 NOTE — Telephone Encounter (Signed)
Patient needs refill sent to [facility] home delivery instead of [facility]  glucose blood (ACCU-CHEK GUIDE) test strip

## 2022-05-02 NOTE — Telephone Encounter (Signed)
Medication sent to pharmacy  

## 2022-05-13 ENCOUNTER — Ambulatory Visit: Payer: Medicare Other | Admitting: Orthopedic Surgery

## 2022-05-20 ENCOUNTER — Other Ambulatory Visit: Payer: Self-pay | Admitting: Internal Medicine

## 2022-05-20 DIAGNOSIS — M25522 Pain in left elbow: Secondary | ICD-10-CM

## 2022-05-20 MED ORDER — TRAMADOL HCL 50 MG PO TABS: 50.0000 mg | ORAL_TABLET | Freq: Three times a day (TID) | ORAL | 0 refills | Status: DC | PRN

## 2022-05-21 ENCOUNTER — Encounter: Payer: Self-pay | Admitting: Orthopedic Surgery

## 2022-05-21 ENCOUNTER — Ambulatory Visit (INDEPENDENT_AMBULATORY_CARE_PROVIDER_SITE_OTHER): Payer: Medicare Other | Admitting: Orthopedic Surgery

## 2022-05-21 ENCOUNTER — Ambulatory Visit (INDEPENDENT_AMBULATORY_CARE_PROVIDER_SITE_OTHER): Payer: Medicare Other

## 2022-05-21 VITALS — BP 138/76 | HR 72 | Ht 66.0 in | Wt 138.0 lb

## 2022-05-21 DIAGNOSIS — M25522 Pain in left elbow: Secondary | ICD-10-CM

## 2022-05-21 NOTE — Patient Instructions (Signed)
Can try medicine like voltaren gel, or patches  Take tramadol once the medicine arrives.  Can also take tylenol or ibuprofen  If the elbow gets swollen again, please call to schedule an appointment

## 2022-05-22 ENCOUNTER — Encounter: Payer: Self-pay | Admitting: Orthopedic Surgery

## 2022-05-22 NOTE — Progress Notes (Signed)
New Patient Visit  Assessment: Keith Nolan is a 51 y.o. male with the following: 1. Pain in left elbow  Plan: Keith Nolan has pain in the left elbow.  On physical exam today, there is no swelling.  No fluctuance.  No concern for an infection.  No tenderness over the lateral epicondyle.  He states that it recently was very swollen.  I have encouraged him to call the clinic if he has acute swelling like this again, so he can be reevaluated then.  Unclear what the snapping sensation is.  No prior injuries to his left elbow.  Continue with medications as needed.  Consider topical treatments.  Encouraged him to work on maintaining his range of motion.  It is possible that he has gout, and she was told he had gout in his foot previously.  He is not on any medications.  Once again, if he has another flare, he should contact clinic for an urgent appointment.  Follow-up: Return if symptoms worsen or fail to improve.  Subjective:  Chief Complaint  Patient presents with   New Patient (Initial Visit)   Elbow Pain    LT elbow Pain comes and goes however, has been hurting everyday x 3 weeks    History of Present Illness: Keith Nolan is a 51 y.o. male who has been referred by  Ihor Dow, MD for evaluation of left elbow pain.  He has had left elbow pain for many years.  It has been intermittent.  Over the last 3 weeks, he notes it has been consistent.  Recently, was very swollen.  However, there is no swelling today.  No prior injuries to his left elbow.  He does not have gout, nor does he take medications for gout.  However, he was told in the past, that he did have gout in his foot.  Occasionally, he does have a popping sensation, when he tries to extend the elbow.  He reports some pain.  He also states that he is reluctant to work on his range of motion due to this sensation.   Review of Systems: No fevers or chills No numbness or tingling No chest pain No shortness of breath No  bowel or bladder dysfunction No GI distress No headaches   Medical History:  Past Medical History:  Diagnosis Date   COPD (chronic obstructive pulmonary disease) ([facility])    DJD (degenerative joint disease)    Seasonal allergies    SHOULDER DISLOCATION-RECURRENT 10/25/2010   Qualifier: Diagnosis of  By: Aline Brochure MD, Madelyn Brunner PATELLAR (MALALIGNMENT) 10/25/2010   Qualifier: Diagnosis of  By: Aline Brochure MD, Dorothyann Peng      Past Surgical History:  Procedure Laterality Date   COLONOSCOPY N/A 07/26/2021   Procedure: COLONOSCOPY;  Surgeon: Rogene Houston, MD;  [facility];  Service: Endoscopy;  Laterality: N/A;  1:15   POLYPECTOMY  07/26/2021   Procedure: POLYPECTOMY;  Surgeon: Rogene Houston, MD;  [facility];  Service: Endoscopy;;    Family History  Problem Relation Age of Onset   Anxiety disorder Mother    Depression Mother    Anxiety disorder Father    Depression Father    Alcohol abuse Father    Alcohol abuse Maternal Grandfather    Social History   Tobacco Use   Smoking status: Every Day    Packs/day: 1.00    Years: 30.00    Total pack years: 30.00    Types: Cigarettes  Smokeless tobacco: Never   Tobacco comments:    provided pt with [place] Quit line information  Vaping Use   Vaping Use: Never used  Substance Use Topics   Alcohol use: No   Drug use: No    No Known Allergies  Current Meds  Medication Sig   Accu-Chek Softclix Lancets lancets CHECK BLOOD SUGAR 4 TIMES DAILY   albuterol (VENTOLIN HFA) 108 (90 Base) MCG/ACT inhaler Inhale 2 puffs into the lungs every 6 (six) hours as needed for wheezing or shortness of breath.   blood glucose meter kit and supplies Dispense based on patient and insurance preference.Testing four times daily DX E11.9   cyclobenzaprine (FLEXERIL) 10 MG tablet Take 1 tablet (10 mg total) by mouth 2 (two) times daily as needed for muscle spasms.   fluticasone (FLONASE) 50 MCG/ACT nasal spray Place 2 sprays  into both nostrils daily.   glucose blood (ACCU-CHEK GUIDE) test strip CHECK BLOOD SUGAR 4 TIMES DAILY   losartan (COZAAR) 25 MG tablet Take 1 tablet (25 mg total) by mouth daily.   mirtazapine (REMERON) 30 MG tablet Take 1 tablet (30 mg total) by mouth at bedtime.   traMADol (ULTRAM) 50 MG tablet Take 1 tablet (50 mg total) by mouth every 8 (eight) hours as needed.   traZODone (DESYREL) 50 MG tablet Take 1 tablet (50 mg total) by mouth at bedtime as needed for sleep. 25-50 mg at night as needed for sleep    Objective: BP 138/76   Pulse 72   Ht 5' 6"  (1.676 m)   Wt 138 lb (62.6 kg)   BMI 22.27 kg/m   Physical Exam:  General: Alert and oriented. and No acute distress. Gait: Normal gait.  Left elbow without swelling.  Tenderness to palpation over the posterior aspect of the elbow, just proximal to the olecranon.  There is no swelling in this area.  No fluctuance is appreciated.  No swelling of the bursitis.  No tenderness palpation of the lateral epicondyle.  Reluctantly, he does have full range of motion of the left elbow.  Fingers are warm and well-perfused.  IMAGING: I personally ordered and reviewed the following images  X-rays of the left elbow were obtained in clinic today.  No acute injuries are noted.  Overall alignment remains good.  Small osteophyte off the tip of the olecranon.  Well-maintained joint space.  Impression: Negative left elbow x-ray  New Medications:  No orders of the defined types were placed in this encounter.     Mordecai Rasmussen, MD  05/22/2022 8:16 AM

## 2022-06-10 ENCOUNTER — Other Ambulatory Visit: Payer: Self-pay | Admitting: Psychiatry

## 2022-06-17 ENCOUNTER — Other Ambulatory Visit: Payer: Self-pay | Admitting: Psychiatry

## 2022-06-17 NOTE — Telephone Encounter (Signed)
Defer to Dr.Hisada 
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
## 2022-07-02 ENCOUNTER — Other Ambulatory Visit: Payer: Self-pay | Admitting: Internal Medicine

## 2022-07-02 DIAGNOSIS — M25522 Pain in left elbow: Secondary | ICD-10-CM

## 2022-07-03 ENCOUNTER — Telehealth: Payer: Medicare Other | Admitting: Psychiatry
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
## 2022-07-17 ENCOUNTER — Encounter: Payer: Self-pay | Admitting: Psychiatry

## 2022-07-17 ENCOUNTER — Telehealth (INDEPENDENT_AMBULATORY_CARE_PROVIDER_SITE_OTHER): Payer: Medicare Other | Admitting: Psychiatry

## 2022-07-17 DIAGNOSIS — F330 Major depressive disorder, recurrent, mild: Secondary | ICD-10-CM | POA: Diagnosis not present

## 2022-07-17 DIAGNOSIS — F4310 Post-traumatic stress disorder, unspecified: Secondary | ICD-10-CM

## 2022-07-17 DIAGNOSIS — G4700 Insomnia, unspecified: Secondary | ICD-10-CM | POA: Diagnosis not present

## 2022-07-17 MED ORDER — VENLAFAXINE HCL ER 75 MG PO CP24: 225.0000 mg | ORAL_CAPSULE | Freq: Every day | ORAL | 0 refills | Status: DC

## 2022-07-17 MED ORDER — MIRTAZAPINE 45 MG PO TABS: 45.0000 mg | ORAL_TABLET | Freq: Every day | ORAL | 0 refills | Status: DC

## 2022-07-17 NOTE — Patient Instructions (Signed)
Continue venlafaxine 225 mg daily  Continue mirtazapine 45 mg at night  Continue trazodone 25-50 mg at night as needed for sleep Next appointment: 12/12 at 10 AM, in person

## 2022-08-18 ENCOUNTER — Other Ambulatory Visit: Payer: Self-pay | Admitting: Psychiatry

## 2022-08-23 ENCOUNTER — Other Ambulatory Visit: Payer: Self-pay | Admitting: Internal Medicine

## 2022-08-23 DIAGNOSIS — G8929 Other chronic pain: Secondary | ICD-10-CM

## 2022-08-26 ENCOUNTER — Encounter: Payer: Medicare Other | Admitting: Internal Medicine

## 2022-08-26 ENCOUNTER — Encounter: Payer: Self-pay | Admitting: Internal Medicine

## 2022-09-17 ENCOUNTER — Other Ambulatory Visit: Payer: Self-pay | Admitting: Psychiatry

## 2022-09-18 ENCOUNTER — Ambulatory Visit (INDEPENDENT_AMBULATORY_CARE_PROVIDER_SITE_OTHER): Payer: Medicare Other | Admitting: Internal Medicine

## 2022-09-18 ENCOUNTER — Encounter: Payer: Self-pay | Admitting: Internal Medicine

## 2022-09-18 VITALS — BP 136/76 | HR 77 | Ht 66.0 in | Wt 134.8 lb

## 2022-09-18 DIAGNOSIS — J4489 Other specified chronic obstructive pulmonary disease: Secondary | ICD-10-CM

## 2022-09-18 DIAGNOSIS — I10 Essential (primary) hypertension: Secondary | ICD-10-CM

## 2022-09-18 DIAGNOSIS — M7730 Calcaneal spur, unspecified foot: Secondary | ICD-10-CM

## 2022-09-18 DIAGNOSIS — Z720 Tobacco use: Secondary | ICD-10-CM | POA: Diagnosis not present

## 2022-09-18 DIAGNOSIS — F4310 Post-traumatic stress disorder, unspecified: Secondary | ICD-10-CM | POA: Diagnosis not present

## 2022-09-18 DIAGNOSIS — G8929 Other chronic pain: Secondary | ICD-10-CM

## 2022-09-18 DIAGNOSIS — E559 Vitamin D deficiency, unspecified: Secondary | ICD-10-CM | POA: Diagnosis not present

## 2022-09-18 DIAGNOSIS — M1A9XX0 Chronic gout, unspecified, without tophus (tophi): Secondary | ICD-10-CM | POA: Insufficient documentation

## 2022-09-18 DIAGNOSIS — Z23 Encounter for immunization: Secondary | ICD-10-CM | POA: Diagnosis not present

## 2022-09-18 DIAGNOSIS — M79672 Pain in left foot: Secondary | ICD-10-CM

## 2022-09-18 DIAGNOSIS — R7303 Prediabetes: Secondary | ICD-10-CM

## 2022-09-18 DIAGNOSIS — Z0001 Encounter for general adult medical examination with abnormal findings: Secondary | ICD-10-CM | POA: Diagnosis not present

## 2022-09-18 DIAGNOSIS — F419 Anxiety disorder, unspecified: Secondary | ICD-10-CM

## 2022-09-18 DIAGNOSIS — E782 Mixed hyperlipidemia: Secondary | ICD-10-CM

## 2022-09-18 DIAGNOSIS — Z122 Encounter for screening for malignant neoplasm of respiratory organs: Secondary | ICD-10-CM

## 2022-09-18 DIAGNOSIS — Z125 Encounter for screening for malignant neoplasm of prostate: Secondary | ICD-10-CM

## 2022-09-18 MED ORDER — TRAMADOL HCL 50 MG PO TABS: 50.0000 mg | ORAL_TABLET | Freq: Three times a day (TID) | ORAL | 0 refills | Status: DC | PRN

## 2022-09-18 MED ORDER — MISC. DEVICES MISC: 0 refills | Status: DC

## 2022-09-18 MED ORDER — MELOXICAM 7.5 MG PO TABS: 7.5000 mg | ORAL_TABLET | Freq: Every day | ORAL | 2 refills | Status: DC

## 2022-09-18 NOTE — Assessment & Plan Note (Addendum)
BP Readings from Last 1 Encounters:  09/18/22 136/76   Usually well-controlled with Losartan 25 mg QD Counseled for compliance with the medications Advised DASH diet and moderate exercise/walking, at least 150 mins/week

## 2022-09-18 NOTE — Assessment & Plan Note (Addendum)
Smokes about 2-3 cigarettes/day, has been trying to cut down.  Asked about quitting: confirms that he is currently smokes cigarettes Advise to quit smoking: Educated about QUITTING to reduce the risk of cancer, cardio and cerebrovascular disease. Assess willingness: Unwilling to quit at this time, but is working on cutting back. Assist with counseling and pharmacotherapy: Counseled for 5 minutes and literature provided. Arrange for follow up: follow up in 3 months and continue to offer help.

## 2022-09-18 NOTE — Progress Notes (Addendum)
Established Patient Office Visit  Subjective:  Patient ID: Keith Nolan, male    DOB: [birth year]  Age: 51 y.o. MRN: [id number]  CC:  Chief Complaint  Patient presents with   Annual Exam    CPE discuss medication, wants rx for wrist bp cuff    HPI Keith Nolan is a 51 y.o. male with past medical history of asthma with COPD, chronic low back pain, chronic opioid medication use in the past, anxiety with PTSD and tobacco abuse who presents for annual physical.  He reports acute on chronic left heel pain.  He reports history of bone spurs in the left heel.  He tried taking naproxen, but had hives with it.  He has had hives with anxiety in the past.  He also reports recurrent swelling over left great toe and was told of gout in the past.  He used to take Uloric for it.  COPD: He is using Breo regularly now.  He has cut down smoking to 2 cigarettes/day.  Denies any dyspnea or wheezing currently.  PTSD and anxiety: He is on Effexor, Remeron and tramadol currently.  He is followed by psychiatry.  Past Medical History:  Diagnosis Date   COPD (chronic obstructive pulmonary disease) ([place])    DJD (degenerative joint disease)    Seasonal allergies    SHOULDER DISLOCATION-RECURRENT 10/25/2010   Qualifier: Diagnosis of  By: Aline Brochure MD, Madelyn Brunner PATELLAR (MALALIGNMENT) 10/25/2010   Qualifier: Diagnosis of  By: Aline Brochure MD, Dorothyann Peng      Past Surgical History:  Procedure Laterality Date   COLONOSCOPY N/A 07/26/2021   Procedure: COLONOSCOPY;  Surgeon: Rogene Houston, MD;  Location: [facility];  Service: Endoscopy;  Laterality: N/A;  1:15   POLYPECTOMY  07/26/2021   Procedure: POLYPECTOMY;  Surgeon: Rogene Houston, MD;  Location: [facility];  Service: Endoscopy;;    Family History  Problem Relation Age of Onset   Anxiety disorder Mother    Depression Mother    Anxiety disorder Father    Depression Father    Alcohol abuse Father    Alcohol abuse Maternal  Grandfather     Social History   Socioeconomic History   Marital status: Single    Spouse name: Not on file   Number of children: 0   Years of education: Not on file   Highest education level: 12th grade  Occupational History   Not on file  Tobacco Use   Smoking status: Every Day    Packs/day: 1.00    Years: 30.00    Additional pack years: 0.00    Total pack years: 30.00    Types: Cigarettes   Smokeless tobacco: Never   Tobacco comments:    provided pt with [place] Quit line information  Vaping Use   Vaping Use: Never used  Substance and Sexual Activity   Alcohol use: No   Drug use: No   Sexual activity: Not Currently  Other Topics Concern   Not on file  Social History Narrative   Not on file   Social Determinants of Health   Financial Resource Strain: Low Risk  (01/19/2023)   Overall Financial Resource Strain (CARDIA)    Difficulty of Paying Living Expenses: Not hard at all  Food Insecurity: No Food Insecurity (01/19/2023)   Hunger Vital Sign    Worried About Running Out of Food in the Last Year: Never true    Ran Out of Food in the Last Year: Never  true  Transportation Needs: No Transportation Needs (01/19/2023)   PRAPARE - Hydrologist (Medical): No    Lack of Transportation (Non-Medical): No  Physical Activity: Insufficiently Active (01/19/2023)   Exercise Vital Sign    Days of Exercise per Week: 2 days    Minutes of Exercise per Session: 30 min  Stress: Stress Concern Present (01/19/2023)   [place]    Feeling of Stress : To some extent  Social Connections: Socially Isolated (01/19/2023)   Social Connection and Isolation Panel [NHANES]    Frequency of Communication with Friends and Family: Once a week    Frequency of Social Gatherings with Friends and Family: Never    Attends Religious Services: Never    Marine scientist or Organizations: No    Attends Programme researcher, broadcasting/film/video: Not on file    Marital Status: Never married  Intimate Partner Violence: Not At Risk (04/03/2021)   Humiliation, Afraid, Rape, and Kick questionnaire    Fear of Current or Ex-Partner: No    Emotionally Abused: No    Physically Abused: No    Sexually Abused: No    Outpatient Medications Prior to Visit  Medication Sig Dispense Refill   albuterol (VENTOLIN HFA) 108 (90 Base) MCG/ACT inhaler Inhale 2 puffs into the lungs every 6 (six) hours as needed for wheezing or shortness of breath. 8 g 5   traZODone (DESYREL) 50 MG tablet Take 1 tablet (50 mg total) by mouth at bedtime as needed for sleep. 25-50 mg at night as needed for sleep 90 tablet 0   Accu-Chek Softclix Lancets lancets CHECK BLOOD SUGAR 4 TIMES DAILY 100 each 2   blood glucose meter kit and supplies Dispense based on patient and insurance preference.Testing four times daily DX E11.9 1 each 0   cyclobenzaprine (FLEXERIL) 10 MG tablet Take 1 tablet (10 mg total) by mouth 2 (two) times daily as needed for muscle spasms. 30 tablet 0   fluticasone (FLONASE) 50 MCG/ACT nasal spray Place 2 sprays into both nostrils daily. 16 g 6   glucose blood (ACCU-CHEK GUIDE) test strip CHECK BLOOD SUGAR 4 TIMES DAILY 100 strip 2   losartan (COZAAR) 25 MG tablet Take 1 tablet (25 mg total) by mouth daily. 90 tablet 3   mirtazapine (REMERON) 45 MG tablet Take 1 tablet (45 mg total) by mouth at bedtime. 90 tablet 0   traMADol (ULTRAM) 50 MG tablet TAKE 1 TABLET BY MOUTH EVERY 8  HOURS AS NEEDED 20 tablet 0   venlafaxine XR (EFFEXOR-XR) 75 MG 24 hr capsule Take 3 capsules (225 mg total) by mouth daily with breakfast. 270 capsule 0   fluticasone furoate-vilanterol (BREO ELLIPTA) 100-25 MCG/ACT AEPB Inhale 1 puff into the lungs daily. 1 each 11   naproxen (NAPROSYN) 500 MG tablet TAKE 1 TABLET BY MOUTH DAILY 100 tablet 2   No facility-administered medications prior to visit.    Allergies  Allergen Reactions   Naproxen Hives     ROS Review of Systems  Constitutional:  Negative for chills and fever.  HENT:  Negative for congestion and sore throat.   Eyes:  Negative for pain and discharge.  Respiratory:  Negative for cough and shortness of breath.   Cardiovascular:  Negative for chest pain and palpitations.  Gastrointestinal:  Negative for diarrhea, nausea and vomiting.  Endocrine: Negative for polydipsia and polyuria.  Genitourinary:  Negative for dysuria and hematuria.  Musculoskeletal:  Positive for arthralgias (Left great toe) and back pain. Negative for neck pain and neck stiffness.  Skin:  Negative for rash.  Neurological:  Negative for dizziness, weakness, numbness and headaches.  Psychiatric/Behavioral:  Negative for agitation and behavioral problems.       Objective:    Physical Exam Vitals reviewed.  Constitutional:      General: He is not in acute distress.    Appearance: He is not diaphoretic.  HENT:     Head: Normocephalic and atraumatic.     Nose: Nose normal.     Mouth/Throat:     Mouth: Mucous membranes are moist.  Eyes:     General: No scleral icterus.    Extraocular Movements: Extraocular movements intact.  Neck:     Vascular: No carotid bruit.  Cardiovascular:     Rate and Rhythm: Normal rate and regular rhythm.     Pulses: Normal pulses.     Heart sounds: Normal heart sounds. No murmur heard. Pulmonary:     Breath sounds: Normal breath sounds. No wheezing or rales.  Abdominal:     Palpations: Abdomen is soft.     Tenderness: There is no abdominal tenderness.  Musculoskeletal:        General: Tenderness (With mild swelling over left 1st MTP joint) present.     Cervical back: Neck supple. No tenderness.     Right lower leg: No edema.     Left lower leg: No edema.  Skin:    General: Skin is warm.     Findings: No rash.  Neurological:     General: No focal deficit present.     Mental Status: He is alert and oriented to person, place, and time.     Cranial Nerves: No  cranial nerve deficit.     Sensory: No sensory deficit.     Motor: No weakness.  Psychiatric:        Mood and Affect: Mood normal.        Behavior: Behavior normal.     BP 136/76 (BP Location: Right Arm, Patient Position: Sitting, Cuff Size: Normal)   Pulse 77   Ht 5\' 6"  (1.676 m)   Wt 134 lb 12.8 oz (61.1 kg)   SpO2 96%   BMI 21.76 kg/m  Wt Readings from Last 3 Encounters:  09/18/22 134 lb 12.8 oz (61.1 kg)  05/21/22 138 lb (62.6 kg)  04/24/22 132 lb 3.2 oz (60 kg)    Lab Results  Component Value Date   TSH 2.350 09/18/2022   Lab Results  Component Value Date   WBC 12.1 (H) 09/18/2022   HGB 13.8 09/18/2022   HCT 40.6 09/18/2022   MCV 89 09/18/2022   PLT 333 09/18/2022   Lab Results  Component Value Date   NA 139 09/18/2022   K 4.9 09/18/2022   CO2 27 09/18/2022   GLUCOSE 74 09/18/2022   BUN 14 09/18/2022   CREATININE 0.87 09/18/2022   BILITOT <0.2 09/18/2022   ALKPHOS 81 09/18/2022   AST 14 09/18/2022   ALT 7 09/18/2022   PROT 7.2 09/18/2022   ALBUMIN 3.8 09/18/2022   CALCIUM 9.3 09/18/2022   ANIONGAP 8 11/06/2018   EGFR 103 09/18/2022   Lab Results  Component Value Date   CHOL 112 09/18/2022   Lab Results  Component Value Date   HDL 39 (L) 09/18/2022   Lab Results  Component Value Date   LDLCALC 58 09/18/2022   Lab Results  Component Value Date   TRIG  72 09/18/2022   Lab Results  Component Value Date   CHOLHDL 2.9 09/18/2022   Lab Results  Component Value Date   HGBA1C 5.7 (H) 09/18/2022      Assessment & Plan:   Problem List Items Addressed This Visit       Cardiovascular and Mediastinum   Essential hypertension    BP Readings from Last 1 Encounters:  09/18/22 136/76  Usually well-controlled with Losartan 25 mg QD Counseled for compliance with the medications Advised DASH diet and moderate exercise/walking, at least 150 mins/week      Relevant Medications   Misc. Devices MISC   Other Relevant Orders   TSH (Completed)    CMP14+EGFR (Completed)   CBC with Differential/Platelet (Completed)     Respiratory   Asthma with COPD (chronic obstructive pulmonary disease)    Albuterol PRN Uses Breo regularly Continues to smoke, has been cutting down.        Musculoskeletal and Integument   Chronic gout involving toe of left foot without tophus    Left great toe swelling could be due to gout Mobic for pain and swelling Will check your uric acid level when acute gout flareup resolves If an elevated uric acid level, can start allopurinol      Relevant Medications   traMADol (ULTRAM) 50 MG tablet     Other   Anxiety    Follows up with Psychiatrist On Effexor, trazodone and Remeron Has tried multiple medications in the past - Zoloft, Trazodone, Xanax      PTSD (post-traumatic stress disorder)    On Effexor according to the last evaluation by Psychiatrist Followed by psychiatrist      Tobacco abuse    Smokes about 2-3 cigarettes/day, has been trying to cut down.  Asked about quitting: confirms that he is currently smokes cigarettes Advise to quit smoking: Educated about QUITTING to reduce the risk of cancer, cardio and cerebrovascular disease. Assess willingness: Unwilling to quit at this time, but is working on cutting back. Assist with counseling and pharmacotherapy: Counseled for 5 minutes and literature provided. Arrange for follow up: follow up in 3 months and continue to offer help.      Encounter for general adult medical examination with abnormal findings - Primary    Annual exam as documented. Counseling done  re healthy lifestyle involving commitment to 150 minutes exercise per week, heart healthy diet, and attaining healthy weight.The importance of adequate sleep also discussed. Changes in health habits are decided on by the patient with goals and time frames  set for achieving them. Immunization and cancer screening needs are specifically addressed at this visit.      Chronic heel pain     Near Achilles tendon area Reports history of bone spurs Mobic as needed for now Had hives with Naproxen, although unclear whether he had it due to anxiety or medicine Tramadol PRN for severe pain Referred to Podiatry      Relevant Medications   traMADol (ULTRAM) 50 MG tablet   Other Relevant Orders   Ambulatory referral to Podiatry   Prostate cancer screening    Ordered PSA after discussing its limitations for prostate cancer screening, including false positive results leading additional investigations.      Relevant Orders   PSA (Completed)   Other Visit Diagnoses     Calcaneal spur, unspecified laterality       Relevant Orders   Ambulatory referral to Podiatry   Prediabetes       Relevant Orders  Hemoglobin A1c (Completed)   CMP14+EGFR (Completed)   Mixed hyperlipidemia       Relevant Orders   Lipid panel (Completed)   Vitamin D deficiency       Relevant Orders   VITAMIN D 25 Hydroxy (Vit-D Deficiency, Fractures) (Completed)   Screening for lung cancer       Relevant Orders   CT CHEST LUNG CANCER SCREENING LOW DOSE WO CONTRAST   Need for immunization against influenza       Relevant Orders   Flu Vaccine QUAD 71mo+IM (Fluarix, Fluzone & Alfiuria Quad PF) (Completed)       Meds ordered this encounter  Medications   DISCONTD: meloxicam (MOBIC) 7.5 MG tablet    Sig: Take 1 tablet (7.5 mg total) by mouth daily.    Dispense:  30 tablet    Refill:  2   traMADol (ULTRAM) 50 MG tablet    Sig: Take 1 tablet (50 mg total) by mouth every 8 (eight) hours as needed.    Dispense:  20 tablet    Refill:  0   Misc. Devices MISC    Sig: Blood pressure cuff/device - wrist cuff - 1. ICD10: I10    Dispense:  1 each    Refill:  0    Follow-up: Return in about 4 months (around 01/17/2023) for HTN and COPD.    Lindell Spar, MD

## 2022-09-18 NOTE — Assessment & Plan Note (Addendum)
Left great toe swelling could be due to gout Mobic for pain and swelling Will check your uric acid level when acute gout flareup resolves If an elevated uric acid level, can start allopurinol

## 2022-09-18 NOTE — Assessment & Plan Note (Addendum)
Albuterol PRN Uses Breo regularly Continues to smoke, has been cutting down.

## 2022-09-18 NOTE — Assessment & Plan Note (Signed)
Ordered PSA after discussing its limitations for prostate cancer screening, including false positive results leading additional investigations. 

## 2022-09-18 NOTE — Assessment & Plan Note (Signed)
On Effexor according to the last evaluation by Psychiatrist Followed by psychiatrist

## 2022-09-18 NOTE — Assessment & Plan Note (Signed)
Follows up with Psychiatrist On Effexor, trazodone and Remeron Has tried multiple medications in the past - Zoloft, Trazodone, Xanax

## 2022-09-18 NOTE — Patient Instructions (Signed)
Please start taking Meloxicam for foot pain. Please take Tramadol for severe pain only.  Please continue to take other medications as prescribed.  Please continue to follow low salt diet and perform moderate exercise/walking at least 150 mins/week.

## 2022-09-18 NOTE — Assessment & Plan Note (Signed)
Annual exam as documented. Counseling done  re healthy lifestyle involving commitment to 150 minutes exercise per week, heart healthy diet, and attaining healthy weight.The importance of adequate sleep also discussed. Changes in health habits are decided on by the patient with goals and time frames  set for achieving them. Immunization and cancer screening needs are specifically addressed at this visit. 

## 2022-09-18 NOTE — Assessment & Plan Note (Signed)
Near Achilles tendon area Reports history of bone spurs Mobic as needed for now Had hives with Naproxen, although unclear whether he had it due to anxiety or medicine Tramadol PRN for severe pain Referred to Podiatry

## 2022-09-19 LAB — CBC WITH DIFFERENTIAL/PLATELET
Basophils Absolute: 0.1 10*3/uL (ref 0.0–0.2)
Basos: 1 %
EOS (ABSOLUTE): 0.4 10*3/uL (ref 0.0–0.4)
Eos: 4 %
Hematocrit: 40.6 % (ref 37.5–51.0)
Hemoglobin: 13.8 g/dL (ref 13.0–17.7)
Immature Grans (Abs): 0 10*3/uL (ref 0.0–0.1)
Immature Granulocytes: 0 %
Lymphocytes Absolute: 2.8 10*3/uL (ref 0.7–3.1)
Lymphs: 23 %
MCH: 30.3 pg (ref 26.6–33.0)
MCHC: 34 g/dL (ref 31.5–35.7)
MCV: 89 fL (ref 79–97)
Monocytes Absolute: 0.7 10*3/uL (ref 0.1–0.9)
Monocytes: 6 %
Neutrophils Absolute: 8 10*3/uL — ABNORMAL HIGH (ref 1.4–7.0)
Neutrophils: 66 %
Platelets: 333 10*3/uL (ref 150–450)
RBC: 4.55 x10E6/uL (ref 4.14–5.80)
RDW: 11.9 % (ref 11.6–15.4)
WBC: 12.1 10*3/uL — ABNORMAL HIGH (ref 3.4–10.8)

## 2022-09-19 LAB — CMP14+EGFR
ALT: 7 IU/L (ref 0–44)
AST: 14 IU/L (ref 0–40)
Albumin/Globulin Ratio: 1.1 — ABNORMAL LOW (ref 1.2–2.2)
Albumin: 3.8 g/dL (ref 3.8–4.9)
Alkaline Phosphatase: 81 IU/L (ref 44–121)
BUN/Creatinine Ratio: 16 (ref 9–20)
BUN: 14 mg/dL (ref 6–24)
Bilirubin Total: <0.2 mg/dL (ref 0.0–1.2)
CO2: 27 mmol/L (ref 20–29)
Calcium: 9.3 mg/dL (ref 8.7–10.2)
Chloride: 102 mmol/L (ref 96–106)
Creatinine, Ser: 0.87 mg/dL (ref 0.76–1.27)
Globulin, Total: 3.4 g/dL (ref 1.5–4.5)
Glucose: 74 mg/dL (ref 70–99)
Potassium: 4.9 mmol/L (ref 3.5–5.2)
Sodium: 139 mmol/L (ref 134–144)
Total Protein: 7.2 g/dL (ref 6.0–8.5)
eGFR: 103 mL/min/{1.73_m2} (ref >59)

## 2022-09-19 LAB — HEMOGLOBIN A1C
Est. average glucose Bld gHb Est-mCnc: 117 mg/dL
Hgb A1c MFr Bld: 5.7 % — ABNORMAL HIGH (ref 4.8–5.6)

## 2022-09-19 LAB — LIPID PANEL
Chol/HDL Ratio: 2.9 ratio (ref 0.0–5.0)
Cholesterol, Total: 112 mg/dL (ref 100–199)
HDL: 39 mg/dL — ABNORMAL LOW (ref >39)
LDL Chol Calc (NIH): 58 mg/dL (ref 0–99)
Triglycerides: 72 mg/dL (ref 0–149)
VLDL Cholesterol Cal: 15 mg/dL (ref 5–40)

## 2022-09-19 LAB — TSH: TSH: 2.35 u[IU]/mL (ref 0.450–4.500)

## 2022-09-19 LAB — VITAMIN D 25 HYDROXY (VIT D DEFICIENCY, FRACTURES): Vit D, 25-Hydroxy: 37.9 ng/mL (ref 30.0–100.0)

## 2022-09-19 LAB — PSA: Prostate Specific Ag, Serum: 3.6 ng/mL (ref 0.0–4.0)
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
## 2022-10-01 ENCOUNTER — Encounter: Payer: Self-pay | Admitting: Psychiatry

## 2022-10-01 ENCOUNTER — Other Ambulatory Visit: Payer: Self-pay | Admitting: Internal Medicine

## 2022-10-01 ENCOUNTER — Ambulatory Visit (INDEPENDENT_AMBULATORY_CARE_PROVIDER_SITE_OTHER): Payer: Medicare Other | Admitting: Psychiatry

## 2022-10-01 ENCOUNTER — Ambulatory Visit
Admission: RE | Admit: 2022-10-01 | Discharge: 2022-10-01 | Disposition: A | Discharge status: Home / Self Care | Payer: Medicare Other | Source: Ambulatory Visit | Attending: Psychiatry | Admitting: Psychiatry

## 2022-10-01 VITALS — BP 154/73 | HR 65 | Temp 98.1°F | Ht 66.0 in | Wt 136.8 lb

## 2022-10-01 DIAGNOSIS — F4310 Post-traumatic stress disorder, unspecified: Secondary | ICD-10-CM | POA: Diagnosis not present

## 2022-10-01 DIAGNOSIS — F331 Major depressive disorder, recurrent, moderate: Secondary | ICD-10-CM | POA: Insufficient documentation

## 2022-10-01 DIAGNOSIS — Z01818 Encounter for other preprocedural examination: Secondary | ICD-10-CM | POA: Diagnosis not present

## 2022-10-01 MED ORDER — MIRTAZAPINE 45 MG PO TABS: 45.0000 mg | ORAL_TABLET | Freq: Every day | ORAL | 0 refills | Status: DC

## 2022-10-01 MED ORDER — ARIPIPRAZOLE 2 MG PO TABS: 2.0000 mg | ORAL_TABLET | Freq: Every day | ORAL | 0 refills | Status: DC

## 2022-10-01 MED ORDER — VENLAFAXINE HCL ER 75 MG PO CP24: 225.0000 mg | ORAL_CAPSULE | Freq: Every day | ORAL | 0 refills | Status: DC

## 2022-10-01 NOTE — Patient Instructions (Signed)
Continue venlafaxine 225 mg daily  Continue mirtazapine 45 mg at night  Obtain EKG Start Abilify 2 mg at night after obtaining EKG Continue trazodone 25-50 mg at night as needed for sleep Next appointment: 2/8 at 11 AM

## 2022-10-01 NOTE — Addendum Note (Signed)
Addended by: [name] on: 10/01/2022 12:40 PM   Modules accepted: Orders

## 2022-10-28 ENCOUNTER — Ambulatory Visit (HOSPITAL_COMMUNITY): Admission: RE | Admit: 2022-10-28 | Payer: Medicare Other | Source: Ambulatory Visit

## 2022-10-28 ENCOUNTER — Ambulatory Visit (INDEPENDENT_AMBULATORY_CARE_PROVIDER_SITE_OTHER): Payer: Medicare Other | Admitting: Podiatry

## 2022-10-28 DIAGNOSIS — Z91199 Patient's noncompliance with other medical treatment and regimen due to unspecified reason: Secondary | ICD-10-CM

## 2022-10-28 NOTE — Progress Notes (Signed)
No show for appt. 

## 2022-11-05 ENCOUNTER — Ambulatory Visit: Payer: 59 | Admitting: Licensed Clinical Social Worker

## 2022-11-19 ENCOUNTER — Telehealth (INDEPENDENT_AMBULATORY_CARE_PROVIDER_SITE_OTHER): Payer: 59 | Admitting: Psychiatry

## 2022-11-19 ENCOUNTER — Other Ambulatory Visit: Payer: Self-pay | Admitting: Internal Medicine

## 2022-11-19 ENCOUNTER — Encounter: Payer: Self-pay | Admitting: Psychiatry

## 2022-11-19 DIAGNOSIS — F1021 Alcohol dependence, in remission: Secondary | ICD-10-CM | POA: Diagnosis not present

## 2022-11-19 DIAGNOSIS — G4700 Insomnia, unspecified: Secondary | ICD-10-CM

## 2022-11-19 DIAGNOSIS — F331 Major depressive disorder, recurrent, moderate: Secondary | ICD-10-CM | POA: Diagnosis not present

## 2022-11-19 DIAGNOSIS — G8929 Other chronic pain: Secondary | ICD-10-CM

## 2022-11-19 DIAGNOSIS — F4310 Post-traumatic stress disorder, unspecified: Secondary | ICD-10-CM
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
## 2022-11-19 NOTE — Patient Instructions (Signed)
Continue venlafaxine 225 mg daily  Continue mirtazapine 45 mg at night  Continue Abilify 2 mg at night  Continue trazodone 25-50 mg at night as needed for sleep Next appointment: 4/1 at 11:30

## 2022-11-21 ENCOUNTER — Ambulatory Visit (INDEPENDENT_AMBULATORY_CARE_PROVIDER_SITE_OTHER): Payer: 59 | Admitting: Licensed Clinical Social Worker

## 2022-11-21 DIAGNOSIS — F331 Major depressive disorder, recurrent, moderate: Secondary | ICD-10-CM | POA: Diagnosis not present

## 2022-11-21 DIAGNOSIS — F4310 Post-traumatic stress disorder, unspecified: Secondary | ICD-10-CM

## 2022-11-21 DIAGNOSIS — G4700 Insomnia, unspecified: Secondary | ICD-10-CM | POA: Diagnosis not present
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
## 2022-11-28 ENCOUNTER — Ambulatory Visit: Payer: Medicare Other | Admitting: Psychiatry

## 2022-12-02 ENCOUNTER — Other Ambulatory Visit: Payer: Self-pay | Admitting: Psychiatry

## 2022-12-05 ENCOUNTER — Other Ambulatory Visit: Payer: Self-pay | Admitting: Internal Medicine

## 2022-12-05 DIAGNOSIS — I10 Essential (primary) hypertension: Secondary | ICD-10-CM

## 2022-12-14 ENCOUNTER — Other Ambulatory Visit: Payer: Self-pay | Admitting: Psychiatry

## 2022-12-19 ENCOUNTER — Encounter: Payer: Self-pay | Admitting: Radiology

## 2023-01-09 ENCOUNTER — Ambulatory Visit: Payer: 59 | Admitting: Licensed Clinical Social Worker

## 2023-01-20 ENCOUNTER — Telehealth: Payer: Self-pay | Admitting: Psychiatry

## 2023-01-20 ENCOUNTER — Encounter: Payer: 59 | Admitting: Psychiatry

## 2023-01-20 ENCOUNTER — Encounter: Payer: Self-pay | Admitting: Internal Medicine

## 2023-01-20 ENCOUNTER — Ambulatory Visit (INDEPENDENT_AMBULATORY_CARE_PROVIDER_SITE_OTHER): Payer: 59 | Admitting: Internal Medicine

## 2023-01-20 VITALS — BP 116/72 | HR 102 | Ht 66.0 in | Wt 134.2 lb

## 2023-01-20 DIAGNOSIS — L509 Urticaria, unspecified: Secondary | ICD-10-CM

## 2023-01-20 DIAGNOSIS — M1A9XX0 Chronic gout, unspecified, without tophus (tophi): Secondary | ICD-10-CM

## 2023-01-20 DIAGNOSIS — F17210 Nicotine dependence, cigarettes, uncomplicated: Secondary | ICD-10-CM

## 2023-01-20 DIAGNOSIS — J4489 Other specified chronic obstructive pulmonary disease: Secondary | ICD-10-CM | POA: Diagnosis not present

## 2023-01-20 DIAGNOSIS — F419 Anxiety disorder, unspecified: Secondary | ICD-10-CM

## 2023-01-20 DIAGNOSIS — Z720 Tobacco use: Secondary | ICD-10-CM

## 2023-01-20 DIAGNOSIS — I10 Essential (primary) hypertension: Secondary | ICD-10-CM

## 2023-01-20 DIAGNOSIS — F4310 Post-traumatic stress disorder, unspecified: Secondary | ICD-10-CM

## 2023-01-20 MED ORDER — MISC. DEVICES MISC: 0 refills | Status: AC

## 2023-01-20 MED ORDER — PREDNISONE 20 MG PO TABS: 40.0000 mg | ORAL_TABLET | Freq: Every day | ORAL | 0 refills | Status: DC

## 2023-01-20 NOTE — Assessment & Plan Note (Addendum)
Well-controlled Albuterol PRN Uses Breo regularly Continues to smoke, has been cutting down.
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
## 2023-01-20 NOTE — Progress Notes (Signed)
This encounter was created in error - please disregard.

## 2023-01-20 NOTE — Progress Notes (Signed)
Established Patient Office Visit  Subjective:  Patient ID: Keith Nolan, male    DOB: 09-28-1971  Age: 52 y.o. MRN: [id number]  CC:  Chief Complaint  Patient presents with   Hypertension    Follow up   COPD    Follow up    HPI Keith Nolan is a 52 y.o. male with past medical history of asthma with COPD, chronic low back pain, chronic opioid medication use in the past, anxiety with PTSD and tobacco abuse who presents for f/u of his chronic medical conditions.  He also complains of left heel pain near Achilles tendon and has limping due to the pain.  Denies any sharp injury recently.  He reports history of bone spurs on the feet.  He was referred to podiatry, but has normal been able to see them yet.  HTN: BP is well-controlled. Takes medications regularly. Patient denies headache, dizziness, chest pain, dyspnea or palpitations.   COPD: He uses Breztri regularly and albuterol as needed for dyspnea or wheezing.  Denies any dyspnea, recent worsening of cough or wheezing currently.  GAD with PTSD: Followed by psychiatry.  He takes Effexor, trazodone and Abilify currently.  Denies any SI or HI currently.  Hives: He has itching rash over bilateral hands, trunk and legs.  It worsens with sunlight exposure and stress.  He has tried Benadryl without much relief.    Past Medical History:  Diagnosis Date   COPD (chronic obstructive pulmonary disease)    DJD (degenerative joint disease)    Seasonal allergies    SHOULDER DISLOCATION-RECURRENT 10/25/2010   Qualifier: Diagnosis of  By: Aline Brochure MD, Madelyn Brunner PATELLAR (MALALIGNMENT) 10/25/2010   Qualifier: Diagnosis of  By: Aline Brochure MD, Dorothyann Peng      Past Surgical History:  Procedure Laterality Date   COLONOSCOPY N/A 07/26/2021   Procedure: COLONOSCOPY;  Surgeon: Rogene Houston, MD;  [facility];  Service: Endoscopy;  Laterality: N/A;  1:15   POLYPECTOMY  07/26/2021   Procedure: POLYPECTOMY;  Surgeon: Rogene Houston, MD;  [facility];  Service: Endoscopy;;    Family History  Problem Relation Age of Onset   Anxiety disorder Mother    Depression Mother    Anxiety disorder Father    Depression Father    Alcohol abuse Father    Alcohol abuse Maternal Grandfather     Social History   Socioeconomic History   Marital status: Single    Spouse name: Not on file   Number of children: 0   Years of education: Not on file   Highest education level: 12th grade  Occupational History   Not on file  Tobacco Use   Smoking status: Every Day    Packs/day: 1.00    Years: 30.00    Additional pack years: 0.00    Total pack years: 30.00    Types: Cigarettes   Smokeless tobacco: Never   Tobacco comments:    provided pt with [place] Quit line information  Vaping Use   Vaping Use: Never used  Substance and Sexual Activity   Alcohol use: No   Drug use: No   Sexual activity: Not Currently  Other Topics Concern   Not on file  Social History Narrative   Not on file   Social Determinants of Health   Financial Resource Strain: Low Risk  (01/19/2023)   Overall Financial Resource Strain (CARDIA)    Difficulty of Paying Living Expenses: Not hard at all  Food Insecurity: No Food Insecurity (01/19/2023)   Hunger Vital Sign    Worried About Running Out of Food in the Last Year: Never true    Ran Out of Food in the Last Year: Never true  Transportation Needs: No Transportation Needs (01/19/2023)   PRAPARE - Hydrologist (Medical): No    Lack of Transportation (Non-Medical): No  Physical Activity: Insufficiently Active (01/19/2023)   Exercise Vital Sign    Days of Exercise per Week: 2 days    Minutes of Exercise per Session: 30 min  Stress: Stress Concern Present (01/19/2023)   [place]    Feeling of Stress : To some extent  Social Connections: Socially Isolated (01/19/2023)   Social Connection and  Isolation Panel [NHANES]    Frequency of Communication with Friends and Family: Once a week    Frequency of Social Gatherings with Friends and Family: Never    Attends Religious Services: Never    Marine scientist or Organizations: No    Attends Music therapist: Not on file    Marital Status: Never married  Intimate Partner Violence: Not At Risk (04/03/2021)   Humiliation, Afraid, Rape, and Kick questionnaire    Fear of Current or Ex-Partner: No    Emotionally Abused: No    Physically Abused: No    Sexually Abused: No    Outpatient Medications Prior to Visit  Medication Sig Dispense Refill   ACCU-CHEK GUIDE test strip CHECK BLOOD SUGAR 4 TIMES DAILY 300 strip 3   Accu-Chek Softclix Lancets lancets USE TO CHECK BLOOD SUGAR 4 TIMES DAILY 300 each 3   albuterol (VENTOLIN HFA) 108 (90 Base) MCG/ACT inhaler Inhale 2 puffs into the lungs every 6 (six) hours as needed for wheezing or shortness of breath. 8 g 5   ARIPiprazole (ABILIFY) 2 MG tablet Take 1 tablet (2 mg total) by mouth at bedtime. 90 tablet 0   Blood Glucose Monitoring Suppl (ACCU-CHEK GUIDE) w/Device KIT TEST 4 TIMES DAILY 100 kit 2   fluticasone furoate-vilanterol (BREO ELLIPTA) 100-25 MCG/ACT AEPB Inhale 1 puff into the lungs daily. 1 each 11   losartan (COZAAR) 25 MG tablet TAKE 1 TABLET BY MOUTH DAILY 100 tablet 2   meloxicam (MOBIC) 7.5 MG tablet TAKE 1 TABLET BY MOUTH DAILY 90 tablet 3   mirtazapine (REMERON) 45 MG tablet Take 1 tablet (45 mg total) by mouth at bedtime. 90 tablet 0   traMADol (ULTRAM) 50 MG tablet Take 1 tablet (50 mg total) by mouth every 8 (eight) hours as needed. 20 tablet 0   traZODone (DESYREL) 50 MG tablet Take 1 tablet (50 mg total) by mouth at bedtime as needed for sleep. 25-50 mg at night as needed for sleep 90 tablet 0   venlafaxine XR (EFFEXOR-XR) 75 MG 24 hr capsule Take 3 capsules (225 mg total) by mouth daily with breakfast. 270 capsule 0   Misc. Devices MISC Blood pressure  cuff/device - wrist cuff - 1. ICD10: I10 1 each 0   No facility-administered medications prior to visit.    Allergies  Allergen Reactions   Naproxen Hives    ROS Review of Systems  Constitutional:  Negative for chills and fever.  HENT:  Negative for congestion and sore throat.   Eyes:  Negative for pain and discharge.  Respiratory:  Negative for cough and shortness of breath.   Cardiovascular:  Negative for chest pain and palpitations.  Gastrointestinal:  Negative for diarrhea, nausea and vomiting.  Endocrine: Negative for polydipsia and polyuria.  Genitourinary:  Negative for dysuria and hematuria.  Musculoskeletal:  Positive for arthralgias (Left elbow) and back pain. Negative for neck pain and neck stiffness.  Skin:  Positive for rash.  Neurological:  Negative for dizziness, weakness, numbness and headaches.  Psychiatric/Behavioral:  Negative for agitation and behavioral problems.       Objective:    Physical Exam Vitals reviewed.  Constitutional:      General: He is not in acute distress.    Appearance: He is not diaphoretic.  HENT:     Head: Normocephalic and atraumatic.     Nose: Nose normal.     Mouth/Throat:     Mouth: Mucous membranes are moist.  Eyes:     General: No scleral icterus.    Extraocular Movements: Extraocular movements intact.  Neck:     Vascular: No carotid bruit.  Cardiovascular:     Rate and Rhythm: Normal rate and regular rhythm.     Pulses: Normal pulses.     Heart sounds: Normal heart sounds. No murmur heard. Pulmonary:     Breath sounds: Normal breath sounds. No wheezing or rales.  Musculoskeletal:        General: Tenderness (With mild swelling over left elbow) present.     Cervical back: Neck supple. No tenderness.     Right lower leg: No edema.     Left lower leg: No edema.  Skin:    General: Skin is warm.     Findings: Rash (Erythematous eruptions over bilateral hands, trunk and legs) present.  Neurological:     General: No  focal deficit present.     Mental Status: He is alert and oriented to person, place, and time.     Sensory: No sensory deficit.     Motor: No weakness.  Psychiatric:        Mood and Affect: Mood normal.        Behavior: Behavior normal.     BP 116/72   Pulse (!) 102   Ht 5\' 6"  (1.676 m)   Wt 134 lb 3.2 oz (60.9 kg)   SpO2 96%   BMI 21.66 kg/m  Wt Readings from Last 3 Encounters:  01/20/23 134 lb 3.2 oz (60.9 kg)  09/18/22 134 lb 12.8 oz (61.1 kg)  05/21/22 138 lb (62.6 kg)    Lab Results  Component Value Date   TSH 2.350 09/18/2022   Lab Results  Component Value Date   WBC 12.1 (H) 09/18/2022   HGB 13.8 09/18/2022   HCT 40.6 09/18/2022   MCV 89 09/18/2022   PLT 333 09/18/2022   Lab Results  Component Value Date   NA 139 09/18/2022   K 4.9 09/18/2022   CO2 27 09/18/2022   GLUCOSE 74 09/18/2022   BUN 14 09/18/2022   CREATININE 0.87 09/18/2022   BILITOT <0.2 09/18/2022   ALKPHOS 81 09/18/2022   AST 14 09/18/2022   ALT 7 09/18/2022   PROT 7.2 09/18/2022   ALBUMIN 3.8 09/18/2022   CALCIUM 9.3 09/18/2022   ANIONGAP 8 11/06/2018   EGFR 103 09/18/2022   Lab Results  Component Value Date   CHOL 112 09/18/2022   Lab Results  Component Value Date   HDL 39 (L) 09/18/2022   Lab Results  Component Value Date   LDLCALC 58 09/18/2022   Lab Results  Component Value Date   TRIG 72 09/18/2022   Lab Results  Component Value Date  CHOLHDL 2.9 09/18/2022   Lab Results  Component Value Date   HGBA1C 5.7 (H) 09/18/2022      Assessment & Plan:   Problem List Items Addressed This Visit       Cardiovascular and Mediastinum   Essential hypertension - Primary    BP Readings from Last 1 Encounters:  01/20/23 116/72  Usually well-controlled with Losartan 25 mg QD Counseled for compliance with the medications Advised DASH diet and moderate exercise/walking, at least 150 mins/week      Relevant Medications   Misc. Devices MISC   Other Relevant Orders    Basic Metabolic Panel (BMET)     Respiratory   Asthma with COPD (chronic obstructive pulmonary disease)    Well-controlled Albuterol PRN Uses Breo regularly Continues to smoke, has been cutting down.      Relevant Medications   predniSONE (DELTASONE) 20 MG tablet     Musculoskeletal and Integument   Urticaria    Erythematous eruptions - worse with sunlight and stress Likely urticaria Oral prednisone prescribed, offered Depo-Medrol IM, but he denied Continue oral antihistaminic such as Benadryl or Zyrtec as needed      Relevant Medications   predniSONE (DELTASONE) 20 MG tablet     Other   Anxiety    Follows up with Psychiatrist On Effexor, trazodone and Remeron Has tried multiple medications in the past - Zoloft, Trazodone, Xanax      PTSD (post-traumatic stress disorder)    On Effexor according to the last evaluation by Psychiatrist Followed by psychiatrist      Tobacco abuse    Smokes about 2-3 cigarettes/day, has been trying to cut down.  Asked about quitting: confirms that he is currently smokes cigarettes Advise to quit smoking: Educated about QUITTING to reduce the risk of cancer, cardio and cerebrovascular disease. Assess willingness: Unwilling to quit at this time, but is working on cutting back. Assist with counseling and pharmacotherapy: Counseled for 5 minutes and literature provided. Arrange for follow up: follow up in 3 months and continue to offer help.      Chronic gout without tophus    Has had left great toe swelling in the past, could be due to gout Mobic for pain and swelling Will check your uric acid level If an elevated uric acid level, can start allopurinol      Relevant Orders   Uric acid   Meds ordered this encounter  Medications   predniSONE (DELTASONE) 20 MG tablet    Sig: Take 2 tablets (40 mg total) by mouth daily with breakfast.    Dispense:  10 tablet    Refill:  0   Misc. Devices MISC    Sig: Blood pressure cuff/device -  wrist cuff - 1. ICD10: I10    Dispense:  1 each    Refill:  0    Follow-up: Return in about 4 months (around 05/22/2023) for HTN and COPD.    Lindell Spar, MD

## 2023-01-20 NOTE — Assessment & Plan Note (Signed)
On Effexor according to the last evaluation by Psychiatrist Followed by psychiatrist 

## 2023-01-20 NOTE — Assessment & Plan Note (Signed)
Erythematous eruptions - worse with sunlight and stress Likely urticaria Oral prednisone prescribed, offered Depo-Medrol IM, but he denied Continue oral antihistaminic such as Benadryl or Zyrtec as needed

## 2023-01-20 NOTE — Telephone Encounter (Signed)
Sent a video visit link through [facility], but the patient didn't sign in. Tried calling for today's appointment, but got no answer. Unable to leave a voice message.

## 2023-01-20 NOTE — Assessment & Plan Note (Signed)
Smokes about 2-3 cigarettes/day, has been trying to cut down.  Asked about quitting: confirms that he is currently smokes cigarettes Advise to quit smoking: Educated about QUITTING to reduce the risk of cancer, cardio and cerebrovascular disease. Assess willingness: Unwilling to quit at this time, but is working on cutting back. Assist with counseling and pharmacotherapy: Counseled for 5 minutes and literature provided. Arrange for follow up: follow up in 3 months and continue to offer help. 

## 2023-01-20 NOTE — Patient Instructions (Signed)
Please continue taking medications as prescribed.  Please continue to follow low salt diet and ambulate as tolerated.  Please consider getting Shingrix and Tdap vaccine at local pharmacy. 

## 2023-01-20 NOTE — Assessment & Plan Note (Signed)
Follows up with Psychiatrist On Effexor, trazodone and Remeron Has tried multiple medications in the past - Zoloft, Trazodone, Xanax 

## 2023-01-20 NOTE — Assessment & Plan Note (Signed)
BP Readings from Last 1 Encounters:  01/20/23 116/72   Usually well-controlled with Losartan 25 mg QD Counseled for compliance with the medications Advised DASH diet and moderate exercise/walking, at least 150 mins/week

## 2023-01-20 NOTE — Assessment & Plan Note (Signed)
Has had left great toe swelling in the past, could be due to gout Mobic for pain and swelling Will check your uric acid level If an elevated uric acid level, can start allopurinol

## 2023-01-22 LAB — BASIC METABOLIC PANEL
BUN/Creatinine Ratio: 16 (ref 9–20)
BUN: 14 mg/dL (ref 6–24)
CO2: 23 mmol/L (ref 20–29)
Calcium: 9.8 mg/dL (ref 8.7–10.2)
Chloride: 102 mmol/L (ref 96–106)
Creatinine, Ser: 0.89 mg/dL (ref 0.76–1.27)
Glucose: 96 mg/dL (ref 70–99)
Potassium: 4.6 mmol/L (ref 3.5–5.2)
Sodium: 141 mmol/L (ref 134–144)
eGFR: 102 mL/min/{1.73_m2} (ref >59)

## 2023-01-22 LAB — URIC ACID: Uric Acid: 5.3 mg/dL (ref 3.8–8.4)

## 2023-01-24 ENCOUNTER — Telehealth: Payer: Self-pay | Admitting: Internal Medicine

## 2023-01-24 NOTE — Telephone Encounter (Signed)
Patient called left voicemail at lunch he has not heard back yet from his test results.

## 2023-01-24 NOTE — Telephone Encounter (Signed)
Spoke with patient.

## 2023-01-29 ENCOUNTER — Ambulatory Visit: Payer: 59 | Admitting: Podiatry

## 2023-02-07 ENCOUNTER — Encounter: Payer: Self-pay | Admitting: Podiatry

## 2023-02-07 ENCOUNTER — Ambulatory Visit (INDEPENDENT_AMBULATORY_CARE_PROVIDER_SITE_OTHER): Payer: 59

## 2023-02-07 ENCOUNTER — Other Ambulatory Visit: Payer: Self-pay | Admitting: Internal Medicine

## 2023-02-07 ENCOUNTER — Ambulatory Visit (INDEPENDENT_AMBULATORY_CARE_PROVIDER_SITE_OTHER): Payer: 59 | Admitting: Podiatry

## 2023-02-07 DIAGNOSIS — J4489 Other specified chronic obstructive pulmonary disease: Secondary | ICD-10-CM

## 2023-02-07 DIAGNOSIS — M7662 Achilles tendinitis, left leg: Secondary | ICD-10-CM | POA: Diagnosis not present

## 2023-02-07 DIAGNOSIS — M722 Plantar fascial fibromatosis: Secondary | ICD-10-CM | POA: Diagnosis not present

## 2023-02-07 DIAGNOSIS — M79672 Pain in left foot: Secondary | ICD-10-CM

## 2023-02-07 MED ORDER — TRIAMCINOLONE ACETONIDE 10 MG/ML IJ SUSP
10.0000 mg | Freq: Once | INTRAMUSCULAR | Status: AC
Start: 2023-02-07 — End: 2023-02-07
  Administered 2023-02-07: 10 mg

## 2023-02-09 NOTE — Progress Notes (Signed)
Subjective:   Patient ID: Keith Nolan, male   DOB: 52 y.o.   MRN: [id number]   HPI Patient presents with left heel and arch forefoot pain of approximate 8-year duration off and on.  Difficult to completely understand the origin of this for the pathology but it appears to be mostly emanating within the heel according to him.  Patient does smoke a pack of cigarettes per day is not active   Review of Systems  All other systems reviewed and are negative.       Objective:  Physical Exam Vitals and nursing note reviewed.  Constitutional:      Appearance: He is well-developed.  Pulmonary:     Effort: Pulmonary effort is normal.  Musculoskeletal:        General: Normal range of motion.  Skin:    General: Skin is warm.  Neurological:     Mental Status: He is alert.     Neurovascular status found to be intact muscle strength was found to be adequate range of motion adequate with discomfort of the left plantar fascia moderate in nature with mild swelling with low-grade arch and forefoot pain left.  Equinus condition noted no other pathology I could see     Assessment:  Difficult to make complete determination on condition as to whether or not this may be just an inflammatory type fascial inflammation or there may be other pathology that is going on     Plan:  H&P reviewed condition and at this point I did sterile prep and I did inject the plantar fascia left 3 mg Kenalog 5 mg Xylocaine advised on good support running shoes and stretching.  Patient will be seen back if symptoms continue do not think at this point surgery would be of benefit  X-rays indicate no spur formation and no indications of stress fracture or other acute pathology

## 2023-02-11 ENCOUNTER — Telehealth: Payer: Self-pay | Admitting: Internal Medicine

## 2023-02-11 NOTE — Telephone Encounter (Signed)
Pt called in regard to disability papers. Pt is needing papers stating hat he is on disabled for fishing license.   Has no forms  in regard, wants a call back.  Also states he has not received writ bp cuff from [employer]

## 2023-02-11 NOTE — Telephone Encounter (Signed)
Letter printed and up front waiting for patient pick up
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
## 2023-02-17 ENCOUNTER — Encounter: Payer: Self-pay | Admitting: Psychiatry

## 2023-02-17 ENCOUNTER — Ambulatory Visit (INDEPENDENT_AMBULATORY_CARE_PROVIDER_SITE_OTHER): Payer: 59 | Admitting: Psychiatry

## 2023-02-17 VITALS — BP 147/70 | HR 75 | Temp 99.5°F | Ht 66.0 in | Wt 139.2 lb

## 2023-02-17 DIAGNOSIS — F331 Major depressive disorder, recurrent, moderate: Secondary | ICD-10-CM | POA: Diagnosis not present

## 2023-02-17 DIAGNOSIS — F4310 Post-traumatic stress disorder, unspecified: Secondary | ICD-10-CM

## 2023-02-17 DIAGNOSIS — G4700 Insomnia, unspecified: Secondary | ICD-10-CM

## 2023-02-18 ENCOUNTER — Ambulatory Visit: Payer: 59 | Admitting: Psychiatry

## 2023-02-18 ENCOUNTER — Other Ambulatory Visit: Payer: Self-pay | Admitting: Podiatry

## 2023-02-18 ENCOUNTER — Telehealth: Payer: Self-pay | Admitting: Psychiatry

## 2023-02-18 DIAGNOSIS — M722 Plantar fascial fibromatosis: Secondary | ICD-10-CM

## 2023-02-18 DIAGNOSIS — M7662 Achilles tendinitis, left leg: Secondary | ICD-10-CM

## 2023-02-18 DIAGNOSIS — M79672 Pain in left foot: Secondary | ICD-10-CM

## 2023-02-18 NOTE — Telephone Encounter (Signed)
Could you please reach out to the patient to confirm his current medication regimen? I would like him to continue taking Venlafaxine 225 mg daily, mirtazapine 45 mg at night, and Abilify 2 mg at night. Although we initially discussed uptitrating Abilify during his visit yesterday, please advise him to remain on the current dose (due to concerns about adherence) for now. It's important to ensure that he continues taking medication for an adequate duration to maximize effectiveness before considering any adjustments.  (According to the nurse, who contacted the pharmacy)  Venlafaxine was last filled on 10/16/22 270 tablets for 90 days no refills  Mirtazapine was last filled on 10/03/22 90 tablets for 90 no refills  Abilify was last filled on 10/03/22 90 tablets for 90 days no refills  Trazodone was last filled on 04/17/22 90 tablets for 90 days no refills

## 2023-02-19 NOTE — Telephone Encounter (Signed)
Have made several attempts to contact patient no answer unable to leave a voicemail due to mailbox being full.

## 2023-03-12 ENCOUNTER — Ambulatory Visit (HOSPITAL_COMMUNITY)
Admission: RE | Admit: 2023-03-12 | Discharge: 2023-03-12 | Disposition: A | Discharge status: Home / Self Care | Payer: 59 | Source: Ambulatory Visit | Attending: Internal Medicine | Admitting: Internal Medicine

## 2023-03-12 DIAGNOSIS — F17210 Nicotine dependence, cigarettes, uncomplicated: Secondary | ICD-10-CM | POA: Insufficient documentation

## 2023-03-12 DIAGNOSIS — Z122 Encounter for screening for malignant neoplasm of respiratory organs: Secondary | ICD-10-CM | POA: Diagnosis not present

## 2023-03-21 ENCOUNTER — Telehealth: Payer: Self-pay | Admitting: Internal Medicine

## 2023-03-21 NOTE — Telephone Encounter (Signed)
Provider has not viewed them yet. Will call when they do.

## 2023-03-21 NOTE — Telephone Encounter (Signed)
Pt called in for Xray results

## 2023-03-27 DIAGNOSIS — H5213 Myopia, bilateral: Secondary | ICD-10-CM | POA: Diagnosis not present

## 2023-03-31 ENCOUNTER — Telehealth: Payer: Self-pay | Admitting: Internal Medicine

## 2023-03-31 ENCOUNTER — Other Ambulatory Visit: Payer: Self-pay | Admitting: Internal Medicine

## 2023-03-31 NOTE — Telephone Encounter (Signed)
Prescription Request  03/31/2023  LOV: 01/20/2023  What is the name of the medication or equipment? traZODone (DESYREL) 50 MG tablet [[id number]]   Have you contacted your pharmacy to request a refill? Yes   Which pharmacy would you like this sent to?  [facility] - [place], [place] - [address] [place] [address] Phone: [phone] Fax: [phone]    Patient notified that their request is being sent to the clinical staff for review and that they should receive a response within 2 business days.   Please advise at Mobile [phone] (mobile)

## 2023-03-31 NOTE — Telephone Encounter (Signed)
Mailbox full

## 2023-03-31 NOTE — Telephone Encounter (Signed)
Patient called about test results. Does not understand it. [phone]
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
## 2023-05-01 ENCOUNTER — Ambulatory Visit: Payer: 59 | Admitting: Psychiatry

## 2023-05-15 DIAGNOSIS — H52223 Regular astigmatism, bilateral: Secondary | ICD-10-CM | POA: Diagnosis not present

## 2023-05-15 DIAGNOSIS — H524 Presbyopia: Secondary | ICD-10-CM | POA: Diagnosis not present

## 2023-05-22 ENCOUNTER — Ambulatory Visit (INDEPENDENT_AMBULATORY_CARE_PROVIDER_SITE_OTHER): Payer: 59 | Admitting: Family Medicine

## 2023-05-22 ENCOUNTER — Encounter: Payer: Self-pay | Admitting: Family Medicine

## 2023-05-22 VITALS — Ht 66.0 in | Wt 139.0 lb

## 2023-05-22 DIAGNOSIS — Z0000 Encounter for general adult medical examination without abnormal findings: Secondary | ICD-10-CM | POA: Diagnosis not present

## 2023-05-22 NOTE — Progress Notes (Signed)
Subjective:   Keith Nolan is a 52 y.o. male who presents for Medicare Annual/Subsequent preventive examination.  Visit Complete: Virtual  I connected with  [name] on 05/22/23 by a audio enabled telemedicine application and verified that I am speaking with the correct person using two identifiers.  Patient Location: Home  Provider Location: Office/Clinic  I discussed the limitations of evaluation and management by telemedicine. The patient expressed understanding and agreed to proceed.  Patient Medicare AWV questionnaire was completed by the patient on 8/12024; I have confirmed that all information answered by patient is correct and no changes since this date.  Review of Systems    Patient denies pain, fever, chills, chest pain, palpations ,shortness of breath, blurred vision,cough, abdominal pain, nausea, vomiting, headache, dizziness. Patient is not feeling nervous or anxious.         Objective:    Today's Vitals   05/22/23 1519  Weight: 139 lb (63 kg)  Height: 5\' 6"  (1.676 m)  PainSc: 7    Body mass index is 22.44 kg/m.     05/22/2023    3:25 PM 04/08/2022   10:22 AM 07/26/2021   12:21 PM 04/03/2021    1:16 PM 12/16/2019    5:54 PM 11/21/2019    3:49 PM 04/07/2019    9:45 PM  Advanced Directives  Does Patient Have a Medical Advance Directive? No No No No No No No  Would patient like information on creating a medical advance directive? No - Patient declined No - Patient declined No - Patient declined No - Patient declined No - Patient declined No - Patient declined     Current Medications (verified) Outpatient Encounter Medications as of 05/22/2023  Medication Sig   ACCU-CHEK GUIDE test strip CHECK BLOOD SUGAR 4 TIMES DAILY   Accu-Chek Softclix Lancets lancets USE TO CHECK BLOOD SUGAR 4 TIMES DAILY   albuterol (VENTOLIN HFA) 108 (90 Base) MCG/ACT inhaler Inhale 2 puffs into the lungs every 6 (six) hours as needed for wheezing or shortness of breath.   Blood  Glucose Monitoring Suppl (ACCU-CHEK GUIDE) w/Device KIT TEST 4 TIMES DAILY   BREO ELLIPTA 100-25 MCG/ACT AEPB USE 1 INHALATION BY MOUTH ONCE  DAILY AT THE SAME TIME EACH DAY   losartan (COZAAR) 25 MG tablet TAKE 1 TABLET BY MOUTH DAILY   meloxicam (MOBIC) 7.5 MG tablet TAKE 1 TABLET BY MOUTH DAILY   Misc. Devices MISC Blood pressure cuff/device - wrist cuff - 1. ICD10: I10   traMADol (ULTRAM) 50 MG tablet Take 1 tablet (50 mg total) by mouth every 8 (eight) hours as needed.   ARIPiprazole (ABILIFY) 2 MG tablet Take 1 tablet (2 mg total) by mouth at bedtime.   mirtazapine (REMERON) 45 MG tablet Take 1 tablet (45 mg total) by mouth at bedtime.   predniSONE (DELTASONE) 20 MG tablet Take 2 tablets (40 mg total) by mouth daily with breakfast.   traZODone (DESYREL) 50 MG tablet Take 1 tablet (50 mg total) by mouth at bedtime as needed for sleep. 25-50 mg at night as needed for sleep   venlafaxine XR (EFFEXOR-XR) 75 MG 24 hr capsule Take 3 capsules (225 mg total) by mouth daily with breakfast.   No facility-administered encounter medications on file as of 05/22/2023.    Allergies (verified) Naproxen   History: Past Medical History:  Diagnosis Date   COPD (chronic obstructive pulmonary disease) (HCC)    DJD (degenerative joint disease)    Seasonal allergies    SHOULDER  DISLOCATION-RECURRENT 10/25/2010   Qualifier: Diagnosis of  By: Romeo Apple MD, Jenny Reichmann PATELLAR (MALALIGNMENT) 10/25/2010   Qualifier: Diagnosis of  By: Romeo Apple MD, Duffy Rhody     Past Surgical History:  Procedure Laterality Date   COLONOSCOPY N/A 07/26/2021   Procedure: COLONOSCOPY;  Surgeon: Malissa Hippo, MD;  Location: [facility];  Service: Endoscopy;  Laterality: N/A;  1:15   POLYPECTOMY  07/26/2021   Procedure: POLYPECTOMY;  Surgeon: Malissa Hippo, MD;  Location: [facility];  Service: Endoscopy;;   Family History  Problem Relation Age of Onset   Anxiety disorder Mother    Depression Mother     Anxiety disorder Father    Depression Father    Alcohol abuse Father    Alcohol abuse Maternal Grandfather    Social History   Socioeconomic History   Marital status: Single    Spouse name: Not on file   Number of children: 0   Years of education: Not on file   Highest education level: 12th grade  Occupational History   Not on file  Tobacco Use   Smoking status: Every Day    Current packs/day: 1.00    Average packs/day: 1 pack/day for 30.0 years (30.0 ttl pk-yrs)    Types: Cigarettes   Smokeless tobacco: Never   Tobacco comments:    provided pt with [place] Quit line information  Vaping Use   Vaping status: Never Used  Substance and Sexual Activity   Alcohol use: No   Drug use: No   Sexual activity: Not Currently  Other Topics Concern   Not on file  Social History Narrative   Not on file   Social Determinants of Health   Financial Resource Strain: Low Risk  (05/22/2023)   Overall Financial Resource Strain (CARDIA)    Difficulty of Paying Living Expenses: Not hard at all  Food Insecurity: No Food Insecurity (05/22/2023)   Hunger Vital Sign    Worried About Running Out of Food in the Last Year: Never true    Ran Out of Food in the Last Year: Never true  Transportation Needs: No Transportation Needs (05/22/2023)   PRAPARE - Administrator, Civil Service (Medical): No    Lack of Transportation (Non-Medical): No  Physical Activity: Sufficiently Active (05/22/2023)   Exercise Vital Sign    Days of Exercise per Week: 7 days    Minutes of Exercise per Session: 40 min  Stress: Stress Concern Present (05/22/2023)   [employer] of Occupational Health - Occupational Stress Questionnaire    Feeling of Stress : Rather much  Social Connections: Unknown (05/22/2023)   Social Connection and Isolation Panel [NHANES]    Frequency of Communication with Friends and Family: Never    Frequency of Social Gatherings with Friends and Family: Never    Attends Religious Services: Never     Database administrator or Organizations: No    Attends Engineer, structural: Never    Marital Status: Patient declined    Tobacco Counseling Ready to quit: No Counseling given: Not Answered Tobacco comments: provided pt with [place] Quit line information   Clinical Intake:  Pre-visit preparation completed: No  Pain : 0-10 Pain Score: 7  Pain Location: Foot Pain Orientation: Left Pain Onset: Yesterday     BMI - recorded: 22.44 Nutritional Status: BMI of 19-24  Normal Diabetes: No  How often do you need to have someone help you when you read instructions, pamphlets, or other  written materials from your doctor or pharmacy?: 1 - Never  Interpreter Needed?: No      Activities of Daily Living    05/22/2023    3:21 PM  In your present state of health, do you have any difficulty performing the following activities:  Hearing? 0  Vision? 0  Difficulty concentrating or making decisions? 0  Walking or climbing stairs? 0  Dressing or bathing? 0  Doing errands, shopping? 0  Preparing Food and eating ? N  Using the Toilet? N  In the past six months, have you accidently leaked urine? N  Do you have problems with loss of bowel control? N  Managing your Medications? N  Managing your Finances? N  Housekeeping or managing your Housekeeping? N    Patient Care Team: Anabel Halon, MD as PCP - General (Internal Medicine)  Indicate any recent Medical Services you may have received from other than [facility] providers in the past year (date may be approximate).     Assessment:   This is a routine wellness examination for Giavanni.  Hearing/Vision screen No results found.  Dietary issues and exercise activities discussed:  Advise to follow a DASH diet which includes vegetables,fruits,whole grains, fat free or low fat diary,fish,poultry,beans,nuts and seeds,vegetable oils. Find an activity that you will enjoy and start to be active at least 5 days a week for 30 minutes each day.     Goals Addressed   None    Depression Screen    02/17/2023    2:12 PM 01/20/2023    1:05 PM 11/21/2022    4:07 PM 10/01/2022    9:56 AM 09/18/2022    1:08 PM 04/24/2022    1:50 PM 04/08/2022   10:23 AM  PHQ 2/9 Scores  PHQ - 2 Score  4   4 0 3  PHQ- 9 Score  14   20 0 12     Information is confidential and restricted. Go to Review Flowsheets to unlock data.    Fall Risk    05/22/2023    3:25 PM 01/20/2023    1:05 PM 09/18/2022    1:08 PM 04/24/2022    1:50 PM 04/08/2022   10:23 AM  Fall Risk   Falls in the past year? 0 0 0 0 0  Number falls in past yr: 0 0 0 0 0  Injury with Fall? 0 0 0 0 0  Risk for fall due to : No Fall Risks No Fall Risks No Fall Risks No Fall Risks No Fall Risks  Follow up Falls evaluation completed Falls evaluation completed Falls evaluation completed Falls evaluation completed Falls evaluation completed    MEDICARE RISK AT HOME:   TIMED UP AND GO:  Was the test performed?  No    Cognitive Function:        05/22/2023    3:25 PM 04/08/2022   10:27 AM 04/03/2021    1:18 PM  6CIT Screen  What Year? 0 points 0 points 0 points  What month? 0 points 0 points 0 points  What time? 0 points 0 points 0 points  Count back from 20 0 points 0 points 0 points  Months in reverse 0 points 2 points 0 points  Repeat phrase 0 points 0 points 0 points  Total Score 0 points 2 points 0 points    Immunizations Immunization History  Administered Date(s) Administered   Influenza,inj,Quad PF,6+ Mos 09/18/2022   Influenza-Unspecified 08/17/2014, 09/14/2018   Moderna Sars-Covid-2 Vaccination 08/30/2020, 09/29/2020  Tdap 05/01/2015    TDAP status: Up to date  Flu Vaccine status: Due, Education has been provided regarding the importance of this vaccine. Advised may receive this vaccine at local pharmacy or Health Dept. Aware to provide a copy of the vaccination record if obtained from local pharmacy or Health Dept. Verbalized acceptance and  understanding.  Pneumococcal vaccine status: Due, Education has been provided regarding the importance of this vaccine. Advised may receive this vaccine at local pharmacy or Health Dept. Aware to provide a copy of the vaccination record if obtained from local pharmacy or Health Dept. Verbalized acceptance and understanding.  Covid-19 vaccine status: Information provided on how to obtain vaccines.   Qualifies for Shingles Vaccine? Yes   Zostavax completed No   Shingrix Completed?: No.    Education has been provided regarding the importance of this vaccine. Patient has been advised to call insurance company to determine out of pocket expense if they have not yet received this vaccine. Advised may also receive vaccine at local pharmacy or Health Dept. Verbalized acceptance and understanding.  Screening Tests Health Maintenance  Topic Date Due   Zoster Vaccines- Shingrix (1 of 2) Never done   Medicare Annual Wellness (AWV)  04/09/2023   INFLUENZA VACCINE  05/22/2023   COVID-19 Vaccine (3 - 2023-24 season) 06/30/2023 (Originally 06/21/2022)   Lung Cancer Screening  03/11/2024   DTaP/Tdap/Td (2 - Td or Tdap) 04/30/2025   Colonoscopy  07/26/2026   Hepatitis C Screening  Completed   HIV Screening  Completed   HPV VACCINES  Aged Out    Health Maintenance  Health Maintenance Due  Topic Date Due   Zoster Vaccines- Shingrix (1 of 2) Never done   Medicare Annual Wellness (AWV)  04/09/2023   INFLUENZA VACCINE  05/22/2023    Colorectal cancer screening: Type of screening: Colonoscopy. Completed 2022. Repeat every 5 years  Lung Cancer Screening: (Low Dose CT Chest recommended if Age 29-80 years, 20 pack-year currently smoking OR have quit w/in 15years.) does qualify.   Lung Cancer Screening Referral: Completed on 03/16/2023  Additional Screening:  Hepatitis C Screening: does not qualify; Completed 2022  Vision Screening: Recommended annual ophthalmology exams for early detection of glaucoma  and other disorders of the eye. Is the patient up to date with their annual eye exam?  Yes  Who is the provider or what is the name of the office in which the patient attends annual eye exams? [facility] If pt is not established with a provider, would they like to be referred to a provider to establish care? No .   Dental Screening: Recommended annual dental exams for proper oral hygiene    Community Resource Referral / Chronic Care Management: CRR required this visit?  No   CCM required this visit?  Appt scheduled with PCP     Plan:     I have personally reviewed and noted the following in the patient's chart:   Medical and social history Use of alcohol, tobacco or illicit drugs  Current medications and supplements including opioid prescriptions. Patient is not currently taking opioid prescriptions. Functional ability and status Nutritional status Physical activity Advanced directives List of other physicians Hospitalizations, surgeries, and ER visits in previous 12 months Vitals Screenings to include cognitive, depression, and falls Referrals and appointments  In addition, I have reviewed and discussed with patient certain preventive protocols, quality metrics, and best practice recommendations. A written personalized care plan for preventive services as well as general preventive health  recommendations were provided to patient.     Cruzita Lederer Newman Nip, FNP   05/22/2023   After Visit Summary: (Mail) Due to this being a telephonic visit, the after visit summary with patients personalized plan was offered to patient via mail

## 2023-05-26 ENCOUNTER — Encounter: Payer: Self-pay | Admitting: Internal Medicine

## 2023-05-26 ENCOUNTER — Ambulatory Visit: Payer: 59 | Admitting: Internal Medicine

## 2023-06-09 ENCOUNTER — Ambulatory Visit: Payer: 59 | Admitting: Licensed Clinical Social Worker

## 2023-06-13 ENCOUNTER — Ambulatory Visit: Payer: 59 | Admitting: Internal Medicine

## 2023-06-13 ENCOUNTER — Other Ambulatory Visit: Payer: Self-pay | Admitting: Internal Medicine
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
## 2023-06-17 ENCOUNTER — Ambulatory Visit: Payer: 59 | Admitting: Psychiatry

## 2023-06-19 ENCOUNTER — Ambulatory Visit (INDEPENDENT_AMBULATORY_CARE_PROVIDER_SITE_OTHER): Payer: 59 | Admitting: Psychiatry

## 2023-06-19 ENCOUNTER — Encounter: Payer: Self-pay | Admitting: Psychiatry

## 2023-06-19 ENCOUNTER — Telehealth: Payer: Self-pay | Admitting: Psychiatry

## 2023-06-19 VITALS — BP 151/79 | HR 60 | Temp 97.8°F | Ht 66.0 in | Wt 136.2 lb

## 2023-06-19 DIAGNOSIS — F331 Major depressive disorder, recurrent, moderate: Secondary | ICD-10-CM

## 2023-06-19 DIAGNOSIS — F4310 Post-traumatic stress disorder, unspecified: Secondary | ICD-10-CM | POA: Diagnosis not present

## 2023-06-19 DIAGNOSIS — G4700 Insomnia, unspecified: Secondary | ICD-10-CM

## 2023-06-19 MED ORDER — VENLAFAXINE HCL ER 75 MG PO CP24: 225.0000 mg | ORAL_CAPSULE | Freq: Every day | ORAL | 0 refills | Status: DC

## 2023-06-19 MED ORDER — TRAZODONE HCL 50 MG PO TABS: 50.0000 mg | ORAL_TABLET | Freq: Every evening | ORAL | 0 refills | Status: DC | PRN

## 2023-06-19 NOTE — Telephone Encounter (Signed)
Please contact him. Based on the records from the pharmacy, it appears he may not have been taking the medication I prescribed consistently. I have sent in a new prescription. Please inform him of the following.  Start venlafaxine 75 mg daily for one week, then 150 mg daily for one week, then 225 mg daily  Start trazodone 25-50 mg at night as needed for insomnia.

## 2023-06-19 NOTE — Patient Instructions (Addendum)
Continue venlafaxine 225 mg daily  Continue mirtazapine 45 mg at night  Continue Abilify 2 mg at night  Continue trazodone 25-50 mg at night as needed for sleep Next appointment: in two months

## 2023-06-20 NOTE — Telephone Encounter (Signed)
Called patient as instructed by provider to inform patient of the message and also the medication instructions patient voiced understanding

## 2023-06-26 ENCOUNTER — Other Ambulatory Visit: Payer: Self-pay | Admitting: Psychiatry

## 2023-06-27 ENCOUNTER — Telehealth: Payer: Self-pay | Admitting: Psychiatry

## 2023-06-27 NOTE — Telephone Encounter (Signed)
I signed a fax from [facility] and placed it on Jess's desk after work hours. Please fax it to them if it hasn't been done already, and notify the patient of the update. Thanks.

## 2023-06-27 NOTE — Telephone Encounter (Signed)
Patient states pharmacy trying to contact office for his refill. [facility] states they have not received prescriptions at this time. Please look into it since he has not medication at this time.

## 2023-06-30 NOTE — Telephone Encounter (Signed)
Faxed and confirmed

## 2023-07-03 ENCOUNTER — Telehealth: Payer: Self-pay

## 2023-07-03 NOTE — Telephone Encounter (Signed)
Could you please contact [facility]? I recall that I filled out a form for venlafaxine with them.

## 2023-07-03 NOTE — Telephone Encounter (Signed)
pt states that he needs a new rx with the current dosage of the venlafaxine [facility]. [facility] not [facility] the [facility]

## 2023-07-07 NOTE — Telephone Encounter (Signed)
pharmacy already called and taken care of this. it should be mailed out today.

## 2023-08-13 ENCOUNTER — Other Ambulatory Visit: Payer: Self-pay | Admitting: Psychiatry

## 2023-08-13 ENCOUNTER — Ambulatory Visit (INDEPENDENT_AMBULATORY_CARE_PROVIDER_SITE_OTHER): Payer: 59 | Admitting: Licensed Clinical Social Worker

## 2023-08-13 DIAGNOSIS — Z91199 Patient's noncompliance with other medical treatment and regimen due to unspecified reason: Secondary | ICD-10-CM

## 2023-08-13 NOTE — Progress Notes (Addendum)
Clinician attempted session via face-to-face, but Keith Nolan did not appear for his session. Cln. called pt. LVM about rescheduling.   Front desk reports pt returned call citing a miscommunication with Medicaid transportation on their end.

## 2023-08-13 NOTE — Addendum Note (Signed)
Addended by: Renee Ramus B on: 08/13/2023 01:22 PM   Modules accepted: Level of Service

## 2023-08-14 ENCOUNTER — Ambulatory Visit: Payer: 59 | Admitting: Psychiatry

## 2023-08-21 ENCOUNTER — Ambulatory Visit: Payer: 59 | Admitting: Internal Medicine

## 2023-08-26 ENCOUNTER — Other Ambulatory Visit: Payer: Self-pay | Admitting: Internal Medicine

## 2023-08-26 DIAGNOSIS — I10 Essential (primary) hypertension: Secondary | ICD-10-CM

## 2023-09-07 ENCOUNTER — Other Ambulatory Visit: Payer: Self-pay | Admitting: Psychiatry
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
## 2023-09-11 ENCOUNTER — Ambulatory Visit: Payer: 59 | Admitting: Psychiatry

## 2023-10-27 ENCOUNTER — Telehealth: Payer: Self-pay | Admitting: Psychiatry

## 2023-10-27 NOTE — Telephone Encounter (Signed)
 I noticed that he hasn't been rescheduled. Could you contact him to arrange an in-person follow-up? Thanks.

## 2023-10-30 NOTE — Telephone Encounter (Signed)
 Patient has trouble with transportation. But he did schedule for 12-16-23 for in person

## 2023-11-05 ENCOUNTER — Encounter: Payer: Self-pay | Admitting: Internal Medicine

## 2023-11-05 ENCOUNTER — Ambulatory Visit (INDEPENDENT_AMBULATORY_CARE_PROVIDER_SITE_OTHER): Payer: 59 | Admitting: Internal Medicine

## 2023-11-05 VITALS — BP 134/70 | HR 87 | Ht 67.0 in | Wt 139.0 lb

## 2023-11-05 DIAGNOSIS — E782 Mixed hyperlipidemia: Secondary | ICD-10-CM | POA: Diagnosis not present

## 2023-11-05 DIAGNOSIS — F419 Anxiety disorder, unspecified: Secondary | ICD-10-CM | POA: Diagnosis not present

## 2023-11-05 DIAGNOSIS — R7303 Prediabetes: Secondary | ICD-10-CM

## 2023-11-05 DIAGNOSIS — M25562 Pain in left knee: Secondary | ICD-10-CM | POA: Insufficient documentation

## 2023-11-05 DIAGNOSIS — I10 Essential (primary) hypertension: Secondary | ICD-10-CM

## 2023-11-05 DIAGNOSIS — E559 Vitamin D deficiency, unspecified: Secondary | ICD-10-CM

## 2023-11-05 DIAGNOSIS — F331 Major depressive disorder, recurrent, moderate: Secondary | ICD-10-CM

## 2023-11-05 DIAGNOSIS — M79672 Pain in left foot: Secondary | ICD-10-CM

## 2023-11-05 DIAGNOSIS — Z720 Tobacco use: Secondary | ICD-10-CM

## 2023-11-05 DIAGNOSIS — J4489 Other specified chronic obstructive pulmonary disease: Secondary | ICD-10-CM | POA: Diagnosis not present

## 2023-11-05 DIAGNOSIS — W19XXXA Unspecified fall, initial encounter: Secondary | ICD-10-CM | POA: Insufficient documentation

## 2023-11-05 DIAGNOSIS — Z125 Encounter for screening for malignant neoplasm of prostate: Secondary | ICD-10-CM

## 2023-11-05 DIAGNOSIS — G8929 Other chronic pain: Secondary | ICD-10-CM | POA: Diagnosis not present

## 2023-11-05 MED ORDER — MELOXICAM 7.5 MG PO TABS: 7.5000 mg | ORAL_TABLET | Freq: Every day | ORAL | 1 refills | Status: DC

## 2023-11-05 MED ORDER — SPHYGMOMANOMETER MISC: 0 refills | Status: AC

## 2023-11-05 MED ORDER — ALBUTEROL SULFATE HFA 108 (90 BASE) MCG/ACT IN AERS: 2.0000 | INHALATION_SPRAY | Freq: Four times a day (QID) | RESPIRATORY_TRACT | 3 refills | Status: DC | PRN

## 2023-11-05 MED ORDER — KNEE BRACE MISC: 0 refills | Status: AC

## 2023-11-05 MED ORDER — LOSARTAN POTASSIUM 25 MG PO TABS: 25.0000 mg | ORAL_TABLET | Freq: Every day | ORAL | 2 refills | Status: DC

## 2023-11-05 NOTE — Patient Instructions (Addendum)
 Please continue to take medications as prescribed.  Please continue to follow low salt diet and perform moderate exercise/walking at least 150 mins/week.  Please get fasting blood tests done before the next visit.

## 2023-11-05 NOTE — Assessment & Plan Note (Signed)
 Ordered PSA after discussing its limitations for prostate cancer screening, including false positive results leading to additional investigations.

## 2023-11-05 NOTE — Assessment & Plan Note (Signed)
 Lab Results  Component Value Date   HGBA1C 5.7 (H) 09/18/2022   Advised to follow DASH diet

## 2023-11-05 NOTE — Assessment & Plan Note (Addendum)
 BP Readings from Last 1 Encounters:  11/05/23 134/70   Usually well-controlled with Losartan  25 mg once daily Needs home BP device to check blood pressure at home, sent prescription Counseled for compliance with the medications Advised DASH diet and moderate exercise/walking, at least 150 mins/week

## 2023-11-05 NOTE — Assessment & Plan Note (Addendum)
 Well-controlled Albuterol  PRN, but has run out, refilled Uses Breo regularly Continues to smoke, has been cutting down.

## 2023-11-05 NOTE — Assessment & Plan Note (Addendum)
 Uncontrolled Follows up with Psychiatrist, needs follow up On Effexor  and trazodone  He was also given Remeron , Abilify  and Gabapentin , unclear if he has run out vs discontinued from Psychiatry Has tried multiple medications in the past - Zoloft , Remeron , Xanax 

## 2023-11-05 NOTE — Assessment & Plan Note (Signed)
 Smokes about 8 cigarettes/day, has been trying to cut down.  Asked about quitting: confirms that he is currently smokes cigarettes Advise to quit smoking: Educated about QUITTING to reduce the risk of cancer, cardio and cerebrovascular disease. Assess willingness: Unwilling to quit at this time, but is working on cutting back. Assist with counseling and pharmacotherapy: Counseled for 5 minutes and literature provided. Arrange for follow up: follow up in 3 months and continue to offer help.

## 2023-11-05 NOTE — Assessment & Plan Note (Signed)
 Due to a fall today Has tenderness and painful ROM of left knee Check x-ray of left knee Knee brace prescribed
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
## 2023-11-05 NOTE — Assessment & Plan Note (Signed)
 Uncontrolled, has history of noncompliance according to psychiatry Currently on Effexor , Remeron , gabapentin , trazodone  and Abilify  - but he is not clear which medicines he has Advised to follow-up with psychiatry

## 2023-11-05 NOTE — Progress Notes (Signed)
 Established Patient Office Visit  Subjective:  Patient ID: Keith Nolan, male    DOB: [birth year]  Age: 53 y.o. MRN: [id number]  CC:  Chief Complaint  Patient presents with   Follow-up    HPI Keith Nolan is a 53 y.o. male with past medical history of asthma with COPD, chronic low back pain, chronic opioid medication use in the past, anxiety with PTSD and tobacco abuse who presents for f/u of his chronic medical conditions.  He also complains of left knee pain due to a fall today in his yard due to snow/ice and has limping due to the pain.  Denies any sharp injury.  He also reports left hand thumb area pain, which is dull, constant since the fall and nonradiating.  HTN: BP is well-controlled. Takes medications regularly. Patient denies headache, dizziness, chest pain, dyspnea or palpitations.   COPD: He uses Breztri regularly and albuterol  as needed for dyspnea or wheezing.  Denies any dyspnea, recent worsening of cough or wheezing currently.  GAD with PTSD: Followed by psychiatry.  He takes Effexor , trazodone  and Abilify  currently.  Denies any SI or HI currently.    Past Medical History:  Diagnosis Date   COPD (chronic obstructive pulmonary disease) (HCC)    DJD (degenerative joint disease)    Seasonal allergies    SHOULDER DISLOCATION-RECURRENT 10/25/2010   Qualifier: Diagnosis of  By: Phyllis Breeze MD, Larissa Plowman PATELLAR (MALALIGNMENT) 10/25/2010   Qualifier: Diagnosis of  By: Phyllis Breeze MD, Arvel Lather      Past Surgical History:  Procedure Laterality Date   COLONOSCOPY N/A 07/26/2021   Procedure: COLONOSCOPY;  Surgeon: Ruby Corporal, MD;  Location: [facility];  Service: Endoscopy;  Laterality: N/A;  1:15   POLYPECTOMY  07/26/2021   Procedure: POLYPECTOMY;  Surgeon: Ruby Corporal, MD;  Location: [facility];  Service: Endoscopy;;    Family History  Problem Relation Age of Onset   Anxiety disorder Mother    Depression Mother    Anxiety disorder Father     Depression Father    Alcohol abuse Father    Alcohol abuse Maternal Grandfather     Social History   Socioeconomic History   Marital status: Single    Spouse name: Not on file   Number of children: 0   Years of education: Not on file   Highest education level: 12th grade  Occupational History   Not on file  Tobacco Use   Smoking status: Every Day    Current packs/day: 1.00    Average packs/day: 1 pack/day for 30.0 years (30.0 ttl pk-yrs)    Types: Cigarettes   Smokeless tobacco: Never   Tobacco comments:    provided pt with [place] Quit line information  Vaping Use   Vaping status: Never Used  Substance and Sexual Activity   Alcohol use: No   Drug use: No   Sexual activity: Not Currently  Other Topics Concern   Not on file  Social History Narrative   Not on file   Social Drivers of Health   Financial Resource Strain: Low Risk  (05/22/2023)   Overall Financial Resource Strain (CARDIA)    Difficulty of Paying Living Expenses: Not hard at all  Food Insecurity: No Food Insecurity (05/22/2023)   Hunger Vital Sign    Worried About Running Out of Food in the Last Year: Never true    Ran Out of Food in the Last Year: Never true  Transportation Needs: No Transportation  Needs (05/22/2023)   PRAPARE - Administrator, Civil Service (Medical): No    Lack of Transportation (Non-Medical): No  Physical Activity: Sufficiently Active (05/22/2023)   Exercise Vital Sign    Days of Exercise per Week: 7 days    Minutes of Exercise per Session: 40 min  Stress: Stress Concern Present (05/22/2023)   [employer] of Occupational Health - Occupational Stress Questionnaire    Feeling of Stress : Rather much  Social Connections: Unknown (05/22/2023)   Social Connection and Isolation Panel [NHANES]    Frequency of Communication with Friends and Family: Never    Frequency of Social Gatherings with Friends and Family: Never    Attends Religious Services: Never    Database administrator  or Organizations: No    Attends Banker Meetings: Never    Marital Status: Patient declined  Catering manager Violence: Not At Risk (05/22/2023)   Humiliation, Afraid, Rape, and Kick questionnaire    Fear of Current or Ex-Partner: No    Emotionally Abused: No    Physically Abused: No    Sexually Abused: No    Outpatient Medications Prior to Visit  Medication Sig Dispense Refill   ACCU-CHEK GUIDE test strip CHECK BLOOD SUGAR 4 TIMES DAILY 400 strip 2   Accu-Chek Softclix Lancets lancets CHECK BLOOD SUGAR 4 TIMES DAILY 400 each 2   Blood Glucose Monitoring Suppl (ACCU-CHEK GUIDE) w/Device KIT TEST 4 TIMES DAILY 100 kit 2   BREO ELLIPTA  100-25 MCG/ACT AEPB USE 1 INHALATION BY MOUTH ONCE  DAILY AT THE SAME TIME EACH DAY 180 each 3   Misc. Devices MISC Blood pressure cuff/device - wrist cuff - 1. ICD10: I10 1 each 0   albuterol  (VENTOLIN  HFA) 108 (90 Base) MCG/ACT inhaler Inhale 2 puffs into the lungs every 6 (six) hours as needed for wheezing or shortness of breath. 8 g 5   losartan  (COZAAR ) 25 MG tablet TAKE 1 TABLET BY MOUTH DAILY 100 tablet 2   meloxicam  (MOBIC ) 7.5 MG tablet TAKE 1 TABLET BY MOUTH DAILY 90 tablet 3   traMADol  (ULTRAM ) 50 MG tablet Take 1 tablet (50 mg total) by mouth every 8 (eight) hours as needed. 20 tablet 0   traZODone  (DESYREL ) 50 MG tablet Take 1 tablet (50 mg total) by mouth at bedtime as needed for sleep. 25-50 mg at night as needed for sleep 90 tablet 0   venlafaxine  XR (EFFEXOR -XR) 75 MG 24 hr capsule Take 3 capsules (225 mg total) by mouth daily with breakfast. 75 mg daily for one week, then 150 mg daily for one week, then 225 mg daily 270 capsule 0   predniSONE  (DELTASONE ) 20 MG tablet Take 2 tablets (40 mg total) by mouth daily with breakfast. 10 tablet 0   No facility-administered medications prior to visit.    Allergies  Allergen Reactions   Grass Extracts [Gramineae Pollens] Hives   Molds & Smuts Hives   Naproxen  Hives   Pollen  Extract-Tree Extract [Pollen Extract] Hives    ROS Review of Systems  Constitutional:  Negative for chills and fever.  HENT:  Negative for congestion and sore throat.   Eyes:  Negative for pain and discharge.  Respiratory:  Negative for cough and shortness of breath.   Cardiovascular:  Negative for chest pain and palpitations.  Gastrointestinal:  Negative for diarrhea, nausea and vomiting.  Endocrine: Negative for polydipsia and polyuria.  Genitourinary:  Negative for dysuria and hematuria.  Musculoskeletal:  Positive for arthralgias (  Left knee and hand) and back pain. Negative for neck pain and neck stiffness.  Skin:  Positive for rash.  Neurological:  Negative for dizziness, weakness, numbness and headaches.  Psychiatric/Behavioral:  Negative for agitation and behavioral problems.       Objective:    Physical Exam Vitals reviewed.  Constitutional:      General: He is not in acute distress.    Appearance: He is not diaphoretic.  HENT:     Head: Normocephalic and atraumatic.     Nose: Nose normal.     Mouth/Throat:     Mouth: Mucous membranes are moist.  Eyes:     General: No scleral icterus.    Extraocular Movements: Extraocular movements intact.  Neck:     Vascular: No carotid bruit.  Cardiovascular:     Rate and Rhythm: Normal rate and regular rhythm.     Pulses: Normal pulses.     Heart sounds: Normal heart sounds. No murmur heard. Pulmonary:     Breath sounds: Normal breath sounds. No wheezing or rales.  Abdominal:     Palpations: Abdomen is soft.     Tenderness: There is no abdominal tenderness.  Musculoskeletal:     Cervical back: Neck supple. No tenderness.     Left knee: Erythema present. Decreased range of motion. Tenderness present.     Right lower leg: No edema.     Left lower leg: No edema.     Comments: Mild tenderness around first MCP joint, of left hand  Skin:    General: Skin is warm.     Findings: Rash (Erythematous eruptions over bilateral  hands, trunk and legs - hives, intermittent) present.  Neurological:     General: No focal deficit present.     Mental Status: He is alert and oriented to person, place, and time.     Sensory: No sensory deficit.     Motor: No weakness.  Psychiatric:        Mood and Affect: Mood normal.        Behavior: Behavior normal.     BP 134/70   Pulse 87   Ht 5\' 7"  (1.702 m)   Wt 139 lb (63 kg)   SpO2 96%   BMI 21.77 kg/m  Wt Readings from Last 3 Encounters:  11/05/23 139 lb (63 kg)  06/19/23 136 lb 3.2 oz (61.8 kg)  05/22/23 139 lb (63 kg)    Lab Results  Component Value Date   TSH 2.350 09/18/2022   Lab Results  Component Value Date   WBC 12.1 (H) 09/18/2022   HGB 13.8 09/18/2022   HCT 40.6 09/18/2022   MCV 89 09/18/2022   PLT 333 09/18/2022   Lab Results  Component Value Date   NA 141 01/20/2023   K 4.6 01/20/2023   CO2 23 01/20/2023   GLUCOSE 96 01/20/2023   BUN 14 01/20/2023   CREATININE 0.89 01/20/2023   BILITOT <0.2 09/18/2022   ALKPHOS 81 09/18/2022   AST 14 09/18/2022   ALT 7 09/18/2022   PROT 7.2 09/18/2022   ALBUMIN 3.8 09/18/2022   CALCIUM 9.8 01/20/2023   ANIONGAP 8 11/06/2018   EGFR 102 01/20/2023   Lab Results  Component Value Date   CHOL 112 09/18/2022   Lab Results  Component Value Date   HDL 39 (L) 09/18/2022   Lab Results  Component Value Date   LDLCALC 58 09/18/2022   Lab Results  Component Value Date   TRIG 72 09/18/2022   Lab  Results  Component Value Date   CHOLHDL 2.9 09/18/2022   Lab Results  Component Value Date   HGBA1C 5.7 (H) 09/18/2022      Assessment & Plan:   Problem List Items Addressed This Visit       Cardiovascular and Mediastinum   Essential hypertension - Primary   BP Readings from Last 1 Encounters:  11/05/23 134/70   Usually well-controlled with Losartan  25 mg once daily Needs home BP device to check blood pressure at home, sent prescription Counseled for compliance with the medications Advised  DASH diet and moderate exercise/walking, at least 150 mins/week      Relevant Medications   losartan  (COZAAR ) 25 MG tablet   Blood Pressure Monitoring (SPHYGMOMANOMETER) MISC   Other Relevant Orders   CMP14+EGFR   CBC with Differential/Platelet     Respiratory   Asthma with COPD (chronic obstructive pulmonary disease) (HCC)   Well-controlled Albuterol  PRN, but has run out, refilled Uses Breo regularly Continues to smoke, has been cutting down.      Relevant Medications   albuterol  (VENTOLIN  HFA) 108 (90 Base) MCG/ACT inhaler     Other   Anxiety   Uncontrolled Follows up with Psychiatrist, needs follow up On Effexor  and trazodone  He was also given Remeron , Abilify  and Gabapentin , unclear if he has run out vs discontinued from Psychiatry Has tried multiple medications in the past - Zoloft , Remeron , Xanax       Tobacco abuse   Smokes about 8 cigarettes/day, has been trying to cut down.  Asked about quitting: confirms that he is currently smokes cigarettes Advise to quit smoking: Educated about QUITTING to reduce the risk of cancer, cardio and cerebrovascular disease. Assess willingness: Unwilling to quit at this time, but is working on cutting back. Assist with counseling and pharmacotherapy: Counseled for 5 minutes and literature provided. Arrange for follow up: follow up in 3 months and continue to offer help.      Chronic heel pain   Relevant Medications   meloxicam  (MOBIC ) 7.5 MG tablet   Prostate cancer screening   Ordered PSA after discussing its limitations for prostate cancer screening, including false positive results leading to additional investigations.      Relevant Orders   VITAMIN D  25 Hydroxy (Vit-D Deficiency, Fractures)   PSA   Moderate episode of recurrent major depressive disorder (HCC)   Uncontrolled, has history of noncompliance according to psychiatry Currently on Effexor , Remeron , gabapentin , trazodone  and Abilify  - but he is not clear which  medicines he has Advised to follow-up with psychiatry      Relevant Orders   TSH   Fall   Mechanical fall, due to ice/snow in his yard Has left hand and knee pain Ordered x-ray of left hand and knee to rule out acute fracture or dislocation Refilled Mobic  as needed for pain      Relevant Orders   DG Hand Complete Left   Prediabetes   Lab Results  Component Value Date   HGBA1C 5.7 (H) 09/18/2022   Advised to follow DASH diet      Relevant Orders   Hemoglobin A1c   CMP14+EGFR   Acute pain of left knee   Due to a fall today Has tenderness and painful ROM of left knee Check x-ray of left knee Knee brace prescribed      Relevant Medications   meloxicam  (MOBIC ) 7.5 MG tablet   Elastic Bandages & Supports (KNEE BRACE) MISC   Other Relevant Orders   DG Knee Complete 4  Views Left   Other Visit Diagnoses       Mixed hyperlipidemia       Relevant Medications   losartan  (COZAAR ) 25 MG tablet   Other Relevant Orders   Lipid panel     Vitamin D  deficiency           Meds ordered this encounter  Medications   losartan  (COZAAR ) 25 MG tablet    Sig: Take 1 tablet (25 mg total) by mouth daily.    Dispense:  100 tablet    Refill:  2    Please send a replace/new response with 100-Day Supply if appropriate to maximize member benefit. Requesting 1 year supply.   meloxicam  (MOBIC ) 7.5 MG tablet    Sig: Take 1 tablet (7.5 mg total) by mouth daily.    Dispense:  90 tablet    Refill:  1    Please send a replace/new response with 100-Day Supply if appropriate to maximize member benefit. Requesting 1 year supply.   albuterol  (VENTOLIN  HFA) 108 (90 Base) MCG/ACT inhaler    Sig: Inhale 2 puffs into the lungs every 6 (six) hours as needed for wheezing or shortness of breath.    Dispense:  18 g    Refill:  3    Okay to substitute to generic/formulary Albuterol .   Elastic Bandages & Supports (KNEE BRACE) MISC    Sig: Use knee brace as directed for knee pain/swelling.    Dispense:   2 each    Refill:  0   Blood Pressure Monitoring (SPHYGMOMANOMETER) MISC    Sig: Check blood pressure as directed once daily.    Dispense:  1 each    Refill:  0    Follow-up: Return in about 4 months (around 03/04/2024).    Meldon Sport, MD
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
## 2023-12-16 ENCOUNTER — Encounter: Payer: Self-pay | Admitting: Psychiatry

## 2023-12-16 ENCOUNTER — Ambulatory Visit (INDEPENDENT_AMBULATORY_CARE_PROVIDER_SITE_OTHER): Payer: 59 | Admitting: Psychiatry

## 2023-12-16 VITALS — BP 114/72 | HR 75 | Temp 98.3°F | Ht 67.0 in | Wt 141.4 lb

## 2023-12-16 DIAGNOSIS — F331 Major depressive disorder, recurrent, moderate: Secondary | ICD-10-CM

## 2023-12-16 DIAGNOSIS — F4310 Post-traumatic stress disorder, unspecified: Secondary | ICD-10-CM | POA: Diagnosis not present

## 2023-12-16 MED ORDER — MIRTAZAPINE 45 MG PO TABS
45.0000 mg | ORAL_TABLET | Freq: Every day | ORAL | 0 refills | Status: DC
Start: 2023-12-16 — End: 2024-07-14

## 2023-12-16 MED ORDER — VENLAFAXINE HCL ER 75 MG PO CP24: 225.0000 mg | ORAL_CAPSULE | Freq: Every day | ORAL | 0 refills | Status: DC

## 2023-12-16 MED ORDER — TRAZODONE HCL 50 MG PO TABS: 50.0000 mg | ORAL_TABLET | Freq: Every evening | ORAL | 0 refills | Status: DC | PRN

## 2023-12-16 NOTE — Patient Instructions (Signed)
 Restart venlafaxine 225 mg daily  Restart mirtazapine 45 mg at night  Restart trazodone 25-50 mg at night as needed for sleep Next appointment: in two months, please contact us by Feb 26

## 2023-12-17 ENCOUNTER — Other Ambulatory Visit: Payer: Self-pay | Admitting: Internal Medicine

## 2023-12-17 DIAGNOSIS — J4489 Other specified chronic obstructive pulmonary disease: Secondary | ICD-10-CM

## 2024-01-13 ENCOUNTER — Other Ambulatory Visit: Payer: Self-pay | Admitting: Internal Medicine

## 2024-01-13 DIAGNOSIS — M25562 Pain in left knee: Secondary | ICD-10-CM

## 2024-01-19 ENCOUNTER — Telehealth: Payer: Self-pay | Admitting: Psychiatry

## 2024-01-19 ENCOUNTER — Ambulatory Visit: Payer: Self-pay

## 2024-01-19 NOTE — Telephone Encounter (Signed)
 Mirtazapine, venlafaxine, trazodone was ordered for 90 days. Could you contact the pharmacy to verify this? Thanks.

## 2024-01-19 NOTE — Telephone Encounter (Signed)
 Patient called to schedule follow up appointment. Also states when here last on 12-16-23, Dr. Vanetta Shawl ordered his 3 medications on line to be delivered at home. He has not yet received them at this time. Please advise and call patient

## 2024-01-19 NOTE — Telephone Encounter (Signed)
 Patient called in to request if PCP can increase his dose of Meloxicam, stating his arthritic pain generalized over whole body is not controlled. Patient states he is open to a change in medication as well as long as pain is controlled. Please contact patient directly if an appt is required.    Copied from CRM [id number]. Topic: Clinical - Medication Question >> Jan 19, 2024  4:19 PM Geneva B wrote: Reason for CRM: patient wants to know if he can get a higher dosage meloxicam (MOBIC) 7.5 MG tablet please call patient [phone] Additional Information  Commented on: Caller requesting an appointment, triage offered and declined    Pain medication increase or change  Answer Assessment - Initial Assessment Questions 1. REASON FOR CALL or QUESTION: "What is your reason for calling today?" or "How can I best help you?" or "What question do you have that I can help answer?"     Please see notes 2. CALLER: Document the source of call. (e.g., laboratory, patient).     Patient  Protocols used: PCP Call - No Triage-A-AH

## 2024-01-20 NOTE — Telephone Encounter (Signed)
 spoke with pt. he will need to call [facility] and see if they can track medication.

## 2024-01-21 ENCOUNTER — Other Ambulatory Visit: Payer: Self-pay | Admitting: Internal Medicine

## 2024-01-21 DIAGNOSIS — M25562 Pain in left knee: Secondary | ICD-10-CM

## 2024-01-21 MED ORDER — MELOXICAM 15 MG PO TABS: 15.0000 mg | ORAL_TABLET | Freq: Every day | ORAL | 1 refills | Status: DC

## 2024-01-22 NOTE — Telephone Encounter (Signed)
 Tried calling pt vm box full.

## 2024-02-09 ENCOUNTER — Other Ambulatory Visit: Payer: Self-pay | Admitting: Psychiatry

## 2024-02-16 ENCOUNTER — Other Ambulatory Visit: Payer: Self-pay | Admitting: Psychiatry

## 2024-02-19 ENCOUNTER — Other Ambulatory Visit: Payer: Self-pay | Admitting: Internal Medicine

## 2024-02-24 ENCOUNTER — Other Ambulatory Visit: Payer: Self-pay | Admitting: Psychiatry

## 2024-03-01 ENCOUNTER — Telehealth: Payer: Self-pay | Admitting: Internal Medicine

## 2024-03-01 NOTE — Telephone Encounter (Signed)
 Copied from CRM [id number]. Topic: Clinical - Medication Refill >> Mar 01, 2024  4:46 PM Phil Braun wrote: Medication: traMADol  (ULTRAM ) 50 MG tablet   Has the patient contacted their pharmacy? Yes  This is the patient's preferred pharmacy:  [facility], [place] - [address] [place] [address] Phone: [phone] Fax: [phone]  Is this the correct pharmacy for this prescription? Yes If no, delete pharmacy and type the correct one.   Has the prescription been filled recently? No  Is the patient out of the medication? Yes  Has the patient been seen for an appointment in the last year OR does the patient have an upcoming appointment? Yes  Can we respond through MyChart? Yes  Agent: Please be advised that Rx refills may take up to 3 business days. We ask that you follow-up with your pharmacy.

## 2024-03-01 NOTE — Telephone Encounter (Signed)
 Last Fill: 09/18/22  Last OV: 11/05/23 Next OV: None Scheduled  Routing to provider for review/authorization.

## 2024-03-02 NOTE — Telephone Encounter (Signed)
Pt mailbox full.
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
## 2024-03-25 ENCOUNTER — Ambulatory Visit: Admitting: Psychiatry

## 2024-05-02 ENCOUNTER — Other Ambulatory Visit: Payer: Self-pay | Admitting: Psychiatry

## 2024-05-04 NOTE — Telephone Encounter (Unsigned)
 Copied from CRM [id number]. Topic: General - Call Back - No Documentation >> May 03, 2024  3:59 PM Montie POUR wrote: Reason for CRM:  Keith Nolan is calling back a missed call. Please call him back if needed at [phone]

## 2024-05-16 ENCOUNTER — Other Ambulatory Visit: Payer: Self-pay | Admitting: Psychiatry

## 2024-06-08 ENCOUNTER — Telehealth: Payer: Self-pay | Admitting: Internal Medicine

## 2024-06-08 DIAGNOSIS — I10 Essential (primary) hypertension: Secondary | ICD-10-CM

## 2024-06-08 DIAGNOSIS — M25562 Pain in left knee: Secondary | ICD-10-CM

## 2024-06-08 DIAGNOSIS — J4489 Other specified chronic obstructive pulmonary disease: Secondary | ICD-10-CM

## 2024-06-08 MED ORDER — MELOXICAM 15 MG PO TABS: 15.0000 mg | ORAL_TABLET | Freq: Every day | ORAL | 1 refills | Status: DC

## 2024-06-08 MED ORDER — LOSARTAN POTASSIUM 25 MG PO TABS: 25.0000 mg | ORAL_TABLET | Freq: Every day | ORAL | 2 refills | Status: AC

## 2024-06-08 MED ORDER — TRAZODONE HCL 50 MG PO TABS: 50.0000 mg | ORAL_TABLET | Freq: Every evening | ORAL | 0 refills | Status: DC | PRN

## 2024-06-08 MED ORDER — ALBUTEROL SULFATE HFA 108 (90 BASE) MCG/ACT IN AERS: 2.0000 | INHALATION_SPRAY | Freq: Four times a day (QID) | RESPIRATORY_TRACT | 3 refills | Status: AC | PRN

## 2024-06-08 NOTE — Telephone Encounter (Signed)
 Copied from CRM #[id number]. Topic: Clinical - Medication Refill >> Jun 08, 2024 10:46 AM Fonda T wrote: Medication:  albuterol  (VENTOLIN  HFA) 108 (90 Base) MCG/ACT inhaler  losartan  (COZAAR ) 25 MG tablet  traZODone  (DESYREL ) 50 MG tablet  meloxicam  (MOBIC ) 15 MG tablet  Patient requesting 90 day supply   Has the patient contacted their pharmacy? Yes Per pharmacy advised patient to contact office  This is the patient's preferred pharmacy:  [facility] - [place], [place] - [address] [place] [address] Phone: [phone] Fax: [phone]  Is this the correct pharmacy for this prescription? Yes If no, delete pharmacy and type the correct one.   Has the prescription been filled recently? Yes  Is the patient out of the medication? Yes, out of meloxicam , and albuterol  at time of call  Has the patient been seen for an appointment in the last year OR does the patient have an upcoming appointment? Yes, appointment, scheduled 07/22/24  Can we respond through MyChart? Yes  Agent: Please be advised that Rx refills may take up to 3 business days. We ask that you follow-up with your pharmacy.

## 2024-06-09 ENCOUNTER — Other Ambulatory Visit: Payer: Self-pay | Admitting: Internal Medicine

## 2024-06-09 DIAGNOSIS — F419 Anxiety disorder, unspecified: Secondary | ICD-10-CM

## 2024-06-09 MED ORDER — TRAZODONE HCL 50 MG PO TABS: 50.0000 mg | ORAL_TABLET | Freq: Every evening | ORAL | 0 refills | Status: DC | PRN

## 2024-07-14 ENCOUNTER — Telehealth: Payer: Self-pay | Admitting: Emergency Medicine

## 2024-07-14 ENCOUNTER — Encounter: Payer: Self-pay | Admitting: Emergency Medicine

## 2024-07-14 ENCOUNTER — Ambulatory Visit
Admission: EM | Admit: 2024-07-14 | Discharge: 2024-07-14 | Disposition: A | Discharge status: Home / Self Care | Attending: Family Medicine | Admitting: Family Medicine

## 2024-07-14 DIAGNOSIS — M25522 Pain in left elbow: Secondary | ICD-10-CM

## 2024-07-14 MED ORDER — PREDNISONE 20 MG PO TABS: 40.0000 mg | ORAL_TABLET | Freq: Every day | ORAL | 0 refills | Status: DC

## 2024-07-14 NOTE — Discharge Instructions (Signed)
 I am suspicious for a gout flare to be causing your symptoms today but we cannot confirm this without blood work which you have deferred to your primary care.  We will treat this with a course of prednisone  to help with pain and inflammation and you may take tart cherry supplements daily.  I have attached some dietary modifications that you may try additionally.

## 2024-07-14 NOTE — Telephone Encounter (Signed)
 Patient requested that Rx for prednisone  be sent to [facility] pharmacy

## 2024-07-14 NOTE — ED Provider Notes (Signed)
 [facility]    CSN: [id number] Arrival date & time: 07/14/24  1226      History   Chief Complaint No chief complaint on file.   HPI Keith Nolan is a 53 y.o. male.   Patient presenting today with 1 day history of left elbow redness, swelling, warmth, decreased range of motion.  Denies fever, chills, nausea, vomiting, injury to the area, numbness, tingling, complete loss of range of motion apart from limitations due to pain.  States he has had similar issues in other areas in the past but has never officially been diagnosed with gout or another type of arthritis.  Tried ibuprofen  yesterday with minimal relief.    Past Medical History:  Diagnosis Date   COPD (chronic obstructive pulmonary disease) (HCC)    DJD (degenerative joint disease)    Seasonal allergies    SHOULDER DISLOCATION-RECURRENT 10/25/2010   Qualifier: Diagnosis of  By: Margrette MD, Taft READING PATELLAR (MALALIGNMENT) 10/25/2010   Qualifier: Diagnosis of  By: Margrette MD, Stanley      Patient Active Problem List   Diagnosis Date Noted   Moderate episode of recurrent major depressive disorder (HCC) 11/05/2023   Fall 11/05/2023   Prediabetes 11/05/2023   Acute pain of left knee 11/05/2023   Urticaria 01/20/2023   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   Prostate cancer screening 09/18/2022   Left elbow pain 04/24/2022   Chronic heel pain 04/24/2022   Influenza vaccine refused 11/06/2021   Tubular adenoma of colon 09/18/2021   Encounter for general adult medical examination with abnormal findings 06/18/2021   Essential hypertension 06/18/2021   Anxiety 11/20/2020   PTSD (post-traumatic stress disorder) 11/20/2020   Chronic low back pain with bilateral sciatica 11/20/2020   Asthma with COPD (chronic obstructive pulmonary disease) (HCC) 11/20/2020   Tobacco abuse 11/20/2020   Erectile dysfunction 11/20/2020    Past Surgical History:  Procedure Laterality Date   COLONOSCOPY N/A  07/26/2021   Procedure: COLONOSCOPY;  Surgeon: Golda Claudis PENNER, MD;  [facility];  Service: Endoscopy;  Laterality: N/A;  1:15   POLYPECTOMY  07/26/2021   Procedure: POLYPECTOMY;  Surgeon: Golda Claudis PENNER, MD;  [facility];  Service: Endoscopy;;       Home Medications    Prior to Admission medications   Medication Sig Start Date End Date Taking? Authorizing Provider  predniSONE  (DELTASONE ) 20 MG tablet Take 2 tablets (40 mg total) by mouth daily with breakfast. 07/14/24  Yes Stuart Vernell Norris, PA-C  ACCU-CHEK GUIDE TEST test strip CHECK BLOOD SUGAR 4 TIMES DAILY 02/20/24   Tobie Suzzane POUR, MD  Accu-Chek Softclix Lancets lancets CHECK BLOOD SUGAR 4 TIMES DAILY 02/20/24   Tobie Suzzane POUR, MD  albuterol  (VENTOLIN  HFA) 108 (90 Base) MCG/ACT inhaler Inhale 2 puffs into the lungs every 6 (six) hours as needed for wheezing or shortness of breath. 06/08/24   Tobie Suzzane POUR, MD  Blood Glucose Monitoring Suppl (ACCU-CHEK GUIDE) w/Device KIT TEST 4 TIMES DAILY 10/01/22   Patel, Rutwik K, MD  Blood Pressure Monitoring [facility]) MISC Check blood pressure as directed once daily. 11/05/23   Tobie Suzzane POUR, MD  BREO ELLIPTA  100-25 MCG/ACT AEPB USE 1 INHALATION BY MOUTH ONCE  DAILY AT THE SAME TIME EACH DAY 12/18/23   Tobie Suzzane POUR, MD  Elastic Bandages & Supports (KNEE BRACE) MISC Use knee brace as directed for knee pain/swelling. 11/05/23   Tobie Suzzane POUR, MD  losartan  (COZAAR ) 25 MG  tablet Take 1 tablet (25 mg total) by mouth daily. 06/08/24   Tobie Suzzane POUR, MD  meloxicam  (MOBIC ) 15 MG tablet Take 1 tablet (15 mg total) by mouth daily. 06/08/24   Tobie Suzzane POUR, MD  Misc. Devices MISC Blood pressure cuff/device - wrist cuff - 1. ICD10: I10 01/20/23   Tobie Suzzane POUR, MD  traZODone  (DESYREL ) 50 MG tablet Take 1 tablet (50 mg total) by mouth at bedtime as needed for sleep. 06/09/24 09/07/24  Tobie Suzzane POUR, MD    Family History Family History  Problem Relation Age of  Onset   Anxiety disorder Mother    Depression Mother    Anxiety disorder Father    Depression Father    Alcohol abuse Father    Alcohol abuse Maternal Grandfather     Social History Social History   Tobacco Use   Smoking status: Every Day    Current packs/day: 1.00    Average packs/day: 1 pack/day for 30.0 years (30.0 ttl pk-yrs)    Types: Cigarettes   Smokeless tobacco: Never   Tobacco comments:    provided pt with [place] Quit line information  Vaping Use   Vaping status: Never Used  Substance Use Topics   Alcohol use: No   Drug use: No     Allergies   Grass extracts [gramineae pollens], Molds & smuts, Naproxen , and Pollen extract-tree extract [pollen extract]   Review of Systems Review of Systems Per HPI  Physical Exam Triage Vital Signs ED Triage Vitals  Encounter Vitals Group     BP 07/14/24 1355 130/69     Girls Systolic BP Percentile --      Girls Diastolic BP Percentile --      Boys Systolic BP Percentile --      Boys Diastolic BP Percentile --      Pulse Rate 07/14/24 1355 71     Resp 07/14/24 1355 18     Temp 07/14/24 1355 97.8 F (36.6 C)     Temp Source 07/14/24 1355 Oral     SpO2 07/14/24 1355 96 %     Weight --      Height --      Head Circumference --      Peak Flow --      Pain Score 07/14/24 1357 7     Pain Loc --      Pain Education --      Exclude from Growth Chart --    No data found.  Updated Vital Signs BP 130/69 (BP Location: Right Arm)   Pulse 71   Temp 97.8 F (36.6 C) (Oral)   Resp 18   SpO2 96%   Visual Acuity Right Eye Distance:   Left Eye Distance:   Bilateral Distance:    Right Eye Near:   Left Eye Near:    Bilateral Near:     Physical Exam Vitals and nursing note reviewed.  Constitutional:      Appearance: Normal appearance.  HENT:     Head: Atraumatic.  Eyes:     Extraocular Movements: Extraocular movements intact.     Conjunctiva/sclera: Conjunctivae normal.  Cardiovascular:     Rate and Rhythm:  Normal rate.  Pulmonary:     Effort: Pulmonary effort is normal.  Musculoskeletal:        General: Swelling and tenderness present. No signs of injury. Normal range of motion.     Cervical back: Normal range of motion and neck supple.     Comments: Localized  erythema, warmth, edema, tenderness to palpation to the left elbow.  Decreased range of motion due to pain  Skin:    General: Skin is warm and dry.     Findings: Erythema present.  Neurological:     Mental Status: He is oriented to person, place, and time.     Comments: Left upper extremity neurovascularly intact  Psychiatric:        Mood and Affect: Mood normal.        Thought Content: Thought content normal.        Judgment: Judgment normal.      UC Treatments / Results  Labs (all labs ordered are listed, but only abnormal results are displayed) Labs Reviewed - No data to display  EKG   Radiology No results found.  Procedures Procedures (including critical care time)  Medications Ordered in UC Medications - No data to display  Initial Impression / Assessment and Plan / UC Course  I have reviewed the triage vital signs and the nursing notes.  Pertinent labs & imaging results that were available during my care of the patient were reviewed by me and considered in my medical decision making (see chart for details).     Suspect gout flare, offered elbow x-ray or uric acid levels today but he declines and wishes to defer to primary care appointment coming up.  Will trial prednisone , over-the-counter pain relievers, supportive home care and follow-up for worsening or unresolving symptoms.  Final Clinical Impressions(s) / UC Diagnoses   Final diagnoses:  Left elbow pain     Discharge Instructions      I am suspicious for a gout flare to be causing your symptoms today but we cannot confirm this without blood work which you have deferred to your primary care.  We will treat this with a course of prednisone  to help  with pain and inflammation and you may take tart cherry supplements daily.  I have attached some dietary modifications that you may try additionally.    ED Prescriptions     Medication Sig Dispense Auth. Provider   predniSONE  (DELTASONE ) 20 MG tablet Take 2 tablets (40 mg total) by mouth daily with breakfast. 10 tablet Stuart Vernell Norris, [place]      PDMP not reviewed this encounter.   Stuart Vernell Norris, PA-C 07/14/24 1511

## 2024-07-14 NOTE — ED Triage Notes (Signed)
 Went to bed and woke up to a jerk in arm   left elbow red and swollen.  Denies any injury.  States noticed some swelling prior to going to bed.  States elbow feels hot.

## 2024-07-15 ENCOUNTER — Ambulatory Visit: Payer: Self-pay

## 2024-07-15 NOTE — Telephone Encounter (Signed)
 FYI Only or Action Required?: Action required by provider: pain medication request, tart cherry rx request.  Patient was last seen in primary care on 11/05/2023 by Tobie Suzzane POUR, MD.  Called Nurse Triage reporting Elbow Injury.  Symptoms began several days ago.  Interventions attempted: OTC medications: Motrin  and Rest, hydration, or home remedies.  Symptoms are: unchanged.  Triage Disposition: See PCP When Office is Open (Within 3 Days)  Patient/caregiver understands and will follow disposition?: Yes   Copied from CRM #[id number]. Topic: Clinical - Red Word Triage >> Jul 15, 2024  5:02 PM Rea C wrote: Red Word that prompted transfer to Nurse Triage: left hyper-extended elbow and gout in elbow. Patient is still in a lot of pain. Patient went to UC yesterday and was not prescribed pain meds. He was prescribed prednisone  but doesn't know when he will get it. Patient was going to go to the ER but he was told that it was backed up. Patient took motrin  and tylenol  and it is not helping. Reason for Disposition  [1] MODERATE pain (e.g., interferes with normal activities) AND [2] present > 3 days  Answer Assessment - Initial Assessment Questions Additional info:  1) Using Motrin  but not very helpful. Will not take Tylenol  as he heard on news it will cause autism and other things, states they are pulling it from the shelves 2) Waiting for prednisone  delivery from [facility]. Does not want to use local pharmacy due to Covid 19 concerns.  3) Refused offer for follow up visit with pcp, he has an appointment scheduled 07/22/24.  Requesting Dr. Tobie send prescription for pain medication to [facility].  Also requesting a prescription for Tart Cherry Gummies to be sent to pharmacy, he was advised at [facility] to start Tart cherry supplement, he states if order is written by pcp his insurance will cover payment.    1. ONSET: When did the pain start?     Several days ago 2. LOCATION: Where is the pain  located?     Left elbow 3. PAIN: How bad is the pain? (Scale 0-10; or none, mild, moderate, severe)     7/10 4. WORK OR EXERCISE: Has there been any recent work or exercise that involved this part of the body?      5. CAUSE: What do you think is causing the arm pain?     Hyperextended and gout 6. OTHER SYMPTOMS: Do you have any other symptoms? (e.g., neck pain, swelling, rash, fever, numbness, weakness)     Feels pulsation in elbow 7. PREGNANCY: Is there any chance you are pregnant? When was your last menstrual period?  Protocols used: Arm Pain-A-AH

## 2024-07-16 ENCOUNTER — Other Ambulatory Visit: Payer: Self-pay | Admitting: Internal Medicine

## 2024-07-16 ENCOUNTER — Telehealth: Payer: Self-pay | Admitting: Internal Medicine

## 2024-07-16 DIAGNOSIS — M25522 Pain in left elbow: Secondary | ICD-10-CM

## 2024-07-16 MED ORDER — TART CHERRY 400 MG PO CHEW: 1.0000 | CHEWABLE_TABLET | Freq: Every day | ORAL | 0 refills | Status: AC

## 2024-07-16 MED ORDER — PREDNISONE 20 MG PO TABS: 40.0000 mg | ORAL_TABLET | Freq: Every day | ORAL | 0 refills | Status: DC

## 2024-07-16 NOTE — Telephone Encounter (Signed)
 No Stephanie in our office, did someone else call him?

## 2024-07-16 NOTE — Telephone Encounter (Signed)
 Unable to speak to patient , number has been disconnected

## 2024-07-16 NOTE — Telephone Encounter (Signed)
 Copied from CRM #[id number]. Topic: General - Call Back - No Documentation >> Jul 15, 2024  4:59 PM Rea C wrote: Reason for CRM: Patient called in asking if [name] can give him a call back.   Patient contact is [phone] (M).

## 2024-07-22 ENCOUNTER — Encounter: Payer: Self-pay | Admitting: Internal Medicine

## 2024-07-22 ENCOUNTER — Ambulatory Visit (INDEPENDENT_AMBULATORY_CARE_PROVIDER_SITE_OTHER): Payer: Self-pay | Admitting: Internal Medicine

## 2024-07-22 VITALS — BP 132/78 | HR 87 | Ht 67.0 in | Wt 138.6 lb

## 2024-07-22 DIAGNOSIS — I10 Essential (primary) hypertension: Secondary | ICD-10-CM | POA: Diagnosis not present

## 2024-07-22 DIAGNOSIS — J4489 Other specified chronic obstructive pulmonary disease: Secondary | ICD-10-CM | POA: Diagnosis not present

## 2024-07-22 DIAGNOSIS — F419 Anxiety disorder, unspecified: Secondary | ICD-10-CM

## 2024-07-22 DIAGNOSIS — F4310 Post-traumatic stress disorder, unspecified: Secondary | ICD-10-CM

## 2024-07-22 DIAGNOSIS — M7032 Other bursitis of elbow, left elbow: Secondary | ICD-10-CM

## 2024-07-22 NOTE — Assessment & Plan Note (Addendum)
 Uncontrolled Used to follow up with Psychiatrist, was on Effexor  and Abilify , has lost follow up with Psychiatry Continue trazodone  50 mg at bedtime for insomnia He was also given Remeron , Effexor  and Gabapentin  in the last visit, unclear if he has run out vs discontinued from Psychiatry Has tried multiple medications in the past - Zoloft , Remeron , Xanax   Referred to [facility] in [place]

## 2024-07-22 NOTE — Progress Notes (Signed)
 Established Patient Office Visit  Subjective:  Patient ID: Keith Nolan, male    DOB: 1971-05-13  Age: 53 y.o. 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    HPI Keith Nolan is a 53 y.o. male with past medical history of asthma with COPD, chronic low back pain, chronic opioid medication use in the past, anxiety with PTSD and tobacco abuse who presents for f/u of his chronic medical conditions.  HTN: BP is well-controlled. Takes medications regularly. Patient denies headache, dizziness, chest pain, dyspnea or palpitations.   COPD: He uses Breo regularly and albuterol  as needed for dyspnea or wheezing.  Denies any dyspnea, recent worsening of cough or wheezing currently.  GAD with PTSD: Used to be followed by psychiatry, but has lost follow up now.  He was taking Effexor , Abilify  and Remeron , but is only taking trazodone  50 mg nightly currently.  He has episodes of anxiety and flashbacks of previous traumatic experiences.  He requests a new psychiatry referral.  Denies any SI or HI currently.  Elbow bursitis: He had left elbow swelling and pain in the last week.  He was given oral prednisone , which has improved his swelling and pain.  He is under permanent disability due to his psychiatric conditions.  He requests a form for permanent fishing license, which was filled and provided to the patient today.  Past Medical History:  Diagnosis Date   COPD (chronic obstructive pulmonary disease) (HCC)    DJD (degenerative joint disease)    Seasonal allergies    SHOULDER DISLOCATION-RECURRENT 10/25/2010   Qualifier: Diagnosis of  By: Margrette MD, Taft READING PATELLAR (MALALIGNMENT) 10/25/2010   Qualifier: Diagnosis of  By: Margrette MD, Taft      Past Surgical History:  Procedure Laterality Date   COLONOSCOPY N/A 07/26/2021   Procedure: COLONOSCOPY;  Surgeon: Golda Claudis PENNER, MD;   [facility]: [facility];  Service: Endoscopy;  Laterality: N/A;  1:15   POLYPECTOMY  07/26/2021   Procedure: POLYPECTOMY;  Surgeon: Golda Claudis PENNER, MD;  Location: [facility];  Service: Endoscopy;;    Family History  Problem Relation Age of Onset   Anxiety disorder Mother    Depression Mother    Anxiety disorder Father    Depression Father    Alcohol abuse Father    Alcohol abuse Maternal Grandfather     Social History   Socioeconomic History   Marital status: Single    Spouse name: Not on file   Number of children: 0   Years of education: Not on file   Highest education level: 12th grade  Occupational History   Not on file  Tobacco Use   Smoking status: Every Day    Current packs/day: 1.00    Average packs/day: 1 pack/day for 30.0 years (30.0 ttl pk-yrs)    Types: Cigarettes   Smokeless tobacco: Never   Tobacco comments:    provided pt with [employer] Quit line information  Vaping Use   Vaping status: Never Used  Substance and Sexual Activity   Alcohol use: No   Drug use: No   Sexual activity: Not Currently  Other Topics Concern   Not on file  Social History Narrative   Not on file   Social Drivers of Health   Financial Resource Strain: Low Risk  (05/22/2023)   Overall Financial Resource Strain (CARDIA)  Difficulty of Paying Living Expenses: Not hard at all  Food Insecurity: No Food Insecurity (05/22/2023)   Hunger Vital Sign    Worried About Running Out of Food in the Last Year: Never true    Ran Out of Food in the Last Year: Never true  Transportation Needs: No Transportation Needs (05/22/2023)   PRAPARE - Administrator, Civil Service (Medical): No    Lack of Transportation (Non-Medical): No  Physical Activity: Sufficiently Active (05/22/2023)   Exercise Vital Sign    Days of Exercise per Week: 7 days    Minutes of Exercise per Session: 40 min  Stress: Stress Concern Present (05/22/2023)   [employer] of Occupational Health - Occupational Stress  Questionnaire    Feeling of Stress : Rather much  Social Connections: Unknown (05/22/2023)   Social Connection and Isolation Panel    Frequency of Communication with Friends and Family: Never    Frequency of Social Gatherings with Friends and Family: Never    Attends Religious Services: Never    Database administrator or Organizations: No    Attends Banker Meetings: Never    Marital Status: Patient declined  Catering manager Violence: Not At Risk (05/22/2023)   Humiliation, Afraid, Rape, and Kick questionnaire    Fear of Current or Ex-Partner: No    Emotionally Abused: No    Physically Abused: No    Sexually Abused: No    Outpatient Medications Prior to Visit  Medication Sig Dispense Refill   ACCU-CHEK GUIDE TEST test strip CHECK BLOOD SUGAR 4 TIMES DAILY 400 strip 2   Accu-Chek Softclix Lancets lancets CHECK BLOOD SUGAR 4 TIMES DAILY 400 each 2   albuterol  (VENTOLIN  HFA) 108 (90 Base) MCG/ACT inhaler Inhale 2 puffs into the lungs every 6 (six) hours as needed for wheezing or shortness of breath. 18 g 3   Blood Glucose Monitoring Suppl (ACCU-CHEK GUIDE) w/Device KIT TEST 4 TIMES DAILY 100 kit 2   Blood Pressure Monitoring (SPHYGMOMANOMETER) MISC Check blood pressure as directed once daily. 1 each 0   BREO ELLIPTA  100-25 MCG/ACT AEPB USE 1 INHALATION BY MOUTH ONCE  DAILY AT THE SAME TIME EACH DAY 180 each 3   Elastic Bandages & Supports (KNEE BRACE) MISC Use knee brace as directed for knee pain/swelling. 2 each 0   losartan  (COZAAR ) 25 MG tablet Take 1 tablet (25 mg total) by mouth daily. 100 tablet 2   meloxicam  (MOBIC ) 15 MG tablet Take 1 tablet (15 mg total) by mouth daily. 90 tablet 1   Misc. Devices MISC Blood pressure cuff/device - wrist cuff - 1. ICD10: I10 1 each 0   Tart Cherry 400 MG CHEW Chew 1 tablet by mouth daily. 30 tablet 0   traZODone  (DESYREL ) 50 MG tablet Take 1 tablet (50 mg total) by mouth at bedtime as needed for sleep. 90 tablet 0   predniSONE   (DELTASONE ) 20 MG tablet Take 2 tablets (40 mg total) by mouth daily with breakfast. 10 tablet 0   No facility-administered medications prior to visit.    Allergies  Allergen Reactions   Grass Extracts [Gramineae Pollens] Hives   Molds & Smuts Hives   Naproxen  Hives   Pollen Extract-Tree Extract [Pollen Extract] Hives    ROS Review of Systems  Constitutional:  Negative for chills and fever.  HENT:  Negative for congestion and sore throat.   Eyes:  Negative for pain and discharge.  Respiratory:  Negative for cough and shortness of breath.  Cardiovascular:  Negative for chest pain and palpitations.  Gastrointestinal:  Negative for diarrhea, nausea and vomiting.  Endocrine: Negative for polydipsia and polyuria.  Genitourinary:  Negative for dysuria and hematuria.  Musculoskeletal:  Positive for back pain. Negative for neck pain and neck stiffness.  Skin:  Positive for rash.  Neurological:  Negative for dizziness, weakness, numbness and headaches.  Psychiatric/Behavioral:  Negative for agitation and behavioral problems.       Objective:    Physical Exam Vitals reviewed.  Constitutional:      General: He is not in acute distress.    Appearance: He is not diaphoretic.  HENT:     Head: Normocephalic and atraumatic.     Nose: Nose normal.     Mouth/Throat:     Mouth: Mucous membranes are moist.  Eyes:     General: No scleral icterus.    Extraocular Movements: Extraocular movements intact.  Neck:     Vascular: No carotid bruit.  Cardiovascular:     Rate and Rhythm: Normal rate and regular rhythm.     Heart sounds: Normal heart sounds. No murmur heard. Pulmonary:     Breath sounds: Normal breath sounds. No wheezing or rales.  Musculoskeletal:     Cervical back: Neck supple. No tenderness.     Right lower leg: No edema.     Left lower leg: No edema.     Comments: Mild tenderness around first MCP joint, of left hand  Skin:    General: Skin is warm.     Findings: Rash  (Erythematous eruptions over bilateral hands, trunk and legs - hives, intermittent) present.  Neurological:     General: No focal deficit present.     Mental Status: He is alert and oriented to person, place, and time.     Sensory: No sensory deficit.     Motor: No weakness.  Psychiatric:        Mood and Affect: Mood is anxious.        Behavior: Behavior is cooperative.     BP 132/78 (BP Location: Left Arm)   Pulse 87   Ht 5' 7 (1.702 m)   Wt 138 lb 9.6 oz (62.9 kg)   SpO2 97%   BMI 21.71 kg/m  Wt Readings from Last 3 Encounters:  07/22/24 138 lb 9.6 oz (62.9 kg)  12/16/23 141 lb 6.4 oz (64.1 kg)  11/05/23 139 lb (63 kg)    Lab Results  Component Value Date   TSH 2.350 09/18/2022   Lab Results  Component Value Date   WBC 12.1 (H) 09/18/2022   HGB 13.8 09/18/2022   HCT 40.6 09/18/2022   MCV 89 09/18/2022   PLT 333 09/18/2022   Lab Results  Component Value Date   NA 141 01/20/2023   K 4.6 01/20/2023   CO2 23 01/20/2023   GLUCOSE 96 01/20/2023   BUN 14 01/20/2023   CREATININE 0.89 01/20/2023   BILITOT <0.2 09/18/2022   ALKPHOS 81 09/18/2022   AST 14 09/18/2022   ALT 7 09/18/2022   PROT 7.2 09/18/2022   ALBUMIN 3.8 09/18/2022   CALCIUM 9.8 01/20/2023   ANIONGAP 8 11/06/2018   EGFR 102 01/20/2023   Lab Results  Component Value Date   CHOL 112 09/18/2022   Lab Results  Component Value Date   HDL 39 (L) 09/18/2022   Lab Results  Component Value Date   LDLCALC 58 09/18/2022   Lab Results  Component Value Date   TRIG 72 09/18/2022  Lab Results  Component Value Date   CHOLHDL 2.9 09/18/2022   Lab Results  Component Value Date   HGBA1C 5.7 (H) 09/18/2022      Assessment & Plan:   Problem List Items Addressed This Visit       Cardiovascular and Mediastinum   Essential hypertension - Primary   BP Readings from Last 1 Encounters:  07/22/24 132/78   Usually well-controlled with Losartan  25 mg once daily Needs home BP device to check blood  pressure at home, sent prescription Counseled for compliance with the medications Advised DASH diet and moderate exercise/walking, at least 150 mins/week        Respiratory   Asthma with COPD (chronic obstructive pulmonary disease) (HCC)   Well-controlled Albuterol  PRN, refilled Uses Breo regularly Continues to smoke, has been cutting down.        Musculoskeletal and Integument   Bursitis of left elbow   Left elbow swelling and pain was likely due to bursitis, picture seen from his phone Complete course of oral prednisone  Advised to avoid repetitive movement of left elbow Advised to apply ice or cool compresses for local relief If persistent, will refer to orthopedic surgery        Other   Anxiety   Uncontrolled Used to follow up with Psychiatrist, was on Effexor  and Abilify , has lost follow up with Psychiatry Continue trazodone  50 mg at bedtime for insomnia He was also given Remeron , Effexor  and Gabapentin  in the last visit, unclear if he has run out vs discontinued from Psychiatry Has tried multiple medications in the past - Zoloft , Remeron , Xanax   Referred to [facility] in [place]      Relevant Orders   Ambulatory referral to Psychiatry   PTSD (post-traumatic stress disorder)   Was on Effexor  according to the last evaluation by Psychiatrist Since it was not helping, he prefers to wait till new psychiatry evaluation      Relevant Orders   Ambulatory referral to Psychiatry     No orders of the defined types were placed in this encounter.   Follow-up: Return in about 4 months (around 11/22/2024) for HTN and COPD.    Suzzane MARLA Blanch, MD

## 2024-07-22 NOTE — Patient Instructions (Addendum)
 Please continue to take medications as prescribed.  Please continue to follow low salt diet and ambulate as tolerated.  You are being referred to [facility] clinic. [address] [place], [place], [place] [address] [phone]

## 2024-07-22 NOTE — Assessment & Plan Note (Signed)
 Was on Effexor  according to the last evaluation by Psychiatrist Since it was not helping, he prefers to wait till new psychiatry evaluation

## 2024-07-22 NOTE — Assessment & Plan Note (Addendum)
 Left elbow swelling and pain was likely due to bursitis, picture seen from his phone Complete course of oral prednisone  Advised to avoid repetitive movement of left elbow Advised to apply ice or cool compresses for local relief If persistent, will refer to orthopedic surgery

## 2024-07-22 NOTE — Assessment & Plan Note (Signed)
 BP Readings from Last 1 Encounters:  07/22/24 132/78   Usually well-controlled with Losartan  25 mg once daily Needs home BP device to check blood pressure at home, sent prescription Counseled for compliance with the medications Advised DASH diet and moderate exercise/walking, at least 150 mins/week

## 2024-07-22 NOTE — Assessment & Plan Note (Signed)
 Well-controlled Albuterol  PRN, refilled Uses Breo regularly Continues to smoke, has been cutting down.

## 2024-08-03 ENCOUNTER — Other Ambulatory Visit: Payer: Self-pay | Admitting: Internal Medicine

## 2024-08-03 DIAGNOSIS — R7303 Prediabetes: Secondary | ICD-10-CM | POA: Diagnosis not present

## 2024-08-03 DIAGNOSIS — E782 Mixed hyperlipidemia: Secondary | ICD-10-CM | POA: Diagnosis not present

## 2024-08-03 DIAGNOSIS — F419 Anxiety disorder, unspecified: Secondary | ICD-10-CM

## 2024-08-03 DIAGNOSIS — I10 Essential (primary) hypertension: Secondary | ICD-10-CM | POA: Diagnosis not present

## 2024-08-04 ENCOUNTER — Other Ambulatory Visit: Payer: Self-pay | Admitting: Internal Medicine

## 2024-08-04 ENCOUNTER — Ambulatory Visit: Payer: Self-pay | Admitting: Internal Medicine

## 2024-08-04 DIAGNOSIS — R972 Elevated prostate specific antigen [PSA]: Secondary | ICD-10-CM

## 2024-08-04 LAB — CBC WITH DIFFERENTIAL/PLATELET
Basophils Absolute: 0.1 x10E3/uL (ref 0.0–0.2)
Basos: 1 %
EOS (ABSOLUTE): 0.4 x10E3/uL (ref 0.0–0.4)
Eos: 3 %
Hematocrit: 45.8 % (ref 37.5–51.0)
Hemoglobin: 15 g/dL (ref 13.0–17.7)
Immature Grans (Abs): 0.1 x10E3/uL (ref 0.0–0.1)
Immature Granulocytes: 1 %
Lymphocytes Absolute: 2.7 x10E3/uL (ref 0.7–3.1)
Lymphs: 25 %
MCH: 30.7 pg (ref 26.6–33.0)
MCHC: 32.8 g/dL (ref 31.5–35.7)
MCV: 94 fL (ref 79–97)
Monocytes Absolute: 0.6 x10E3/uL (ref 0.1–0.9)
Monocytes: 6 %
Neutrophils Absolute: 6.9 x10E3/uL (ref 1.4–7.0)
Neutrophils: 64 %
Platelets: 333 x10E3/uL (ref 150–450)
RBC: 4.89 x10E6/uL (ref 4.14–5.80)
RDW: 12 % (ref 11.6–15.4)
WBC: 10.8 x10E3/uL (ref 3.4–10.8)

## 2024-08-04 LAB — CMP14+EGFR
ALT: 12 IU/L (ref 0–44)
AST: 23 IU/L (ref 0–40)
Albumin: 4 g/dL (ref 3.8–4.9)
Alkaline Phosphatase: 88 IU/L (ref 47–123)
BUN/Creatinine Ratio: 14 (ref 9–20)
BUN: 11 mg/dL (ref 6–24)
Bilirubin Total: 0.4 mg/dL (ref 0.0–1.2)
CO2: 24 mmol/L (ref 20–29)
Calcium: 9.7 mg/dL (ref 8.7–10.2)
Chloride: 101 mmol/L (ref 96–106)
Creatinine, Ser: 0.8 mg/dL (ref 0.76–1.27)
Globulin, Total: 3.6 g/dL (ref 1.5–4.5)
Glucose: 76 mg/dL (ref 70–99)
Potassium: 4.5 mmol/L (ref 3.5–5.2)
Sodium: 138 mmol/L (ref 134–144)
Total Protein: 7.6 g/dL (ref 6.0–8.5)
eGFR: 105 mL/min/1.73 (ref >59)

## 2024-08-04 LAB — LIPID PANEL
Chol/HDL Ratio: 3.2 ratio (ref 0.0–5.0)
Cholesterol, Total: 120 mg/dL (ref 100–199)
HDL: 38 mg/dL — ABNORMAL LOW (ref >39)
LDL Chol Calc (NIH): 65 mg/dL (ref 0–99)
Triglycerides: 85 mg/dL (ref 0–149)
VLDL Cholesterol Cal: 17 mg/dL (ref 5–40)

## 2024-08-04 LAB — PSA: Prostate Specific Ag, Serum: 4.5 ng/mL — ABNORMAL HIGH (ref 0.0–4.0)

## 2024-08-04 LAB — HEMOGLOBIN A1C
Est. average glucose Bld gHb Est-mCnc: 117 mg/dL
Hgb A1c MFr Bld: 5.7 % — ABNORMAL HIGH (ref 4.8–5.6)

## 2024-08-04 LAB — VITAMIN D 25 HYDROXY (VIT D DEFICIENCY, FRACTURES): Vit D, 25-Hydroxy: 38.4 ng/mL (ref 30.0–100.0)

## 2024-08-04 LAB — TSH: TSH: 1.71 u[IU]/mL (ref 0.450–4.500)

## 2024-08-06 ENCOUNTER — Telehealth: Payer: Self-pay

## 2024-08-06 ENCOUNTER — Ambulatory Visit: Payer: Self-pay

## 2024-08-06 NOTE — Telephone Encounter (Signed)
 Copied from CRM [id number]. Topic: Clinical - Lab/Test Results >> Aug 06, 2024  9:06 AM Ahlexyia S wrote: Reason for CRM: Pt called in to get his previous lab results. Per pt chart there was a message sent on Oct 15th. Informed pt of message but pt had additional questions. Informed pt someone will give him a call shortly.

## 2024-08-06 NOTE — Telephone Encounter (Signed)
 FYI Only or Action Required?: FYI only for provider.  Patient was last seen in primary care on 07/22/2024 by Tobie Suzzane POUR, MD.  Called Nurse Triage reporting Lab Result Question.  Symptoms began n/a.  Interventions attempted: Other: n/a.  Symptoms are: n/a.  Triage Disposition: Information or Advice Only Call  Patient/caregiver understands and will follow disposition?: Yes Reason for Disposition  Health information question, no triage required and triager able to answer question  Answer Assessment - Initial Assessment Questions Relayed results from provider on lab results for patient. No further questions at this time  1. REASON FOR CALL: What is the main reason for your call? or How can I best help you?     Calling in for lab results  2. SYMPTOMS : Do you have any symptoms?      Denies  Protocols used: Information Only Call - No Triage-A-AH  Copied from CRM [id number]. Topic: General - Other >> Aug 06, 2024  1:44 PM Donna BRAVO wrote: Reason for CRM: patient retuning call from [facility] On hold with clinic for 10 min  Patient asked to speak with nurse triage

## 2024-08-06 NOTE — Telephone Encounter (Signed)
 Lmtrc

## 2024-08-09 NOTE — Telephone Encounter (Signed)
Unable to speak to patient

## 2024-08-16 ENCOUNTER — Other Ambulatory Visit: Payer: Self-pay | Admitting: Internal Medicine

## 2024-08-16 DIAGNOSIS — M25562 Pain in left knee: Secondary | ICD-10-CM

## 2024-09-20 ENCOUNTER — Ambulatory Visit: Payer: Self-pay

## 2024-09-23 ENCOUNTER — Ambulatory Visit

## 2024-11-02 ENCOUNTER — Ambulatory Visit: Payer: Self-pay

## 2024-11-02 ENCOUNTER — Emergency Department (HOSPITAL_COMMUNITY)
Admission: EM | Admit: 2024-11-02 | Discharge: 2024-11-02 | Disposition: A | Discharge status: Home / Self Care | Source: Ambulatory Visit | Attending: Emergency Medicine | Admitting: Emergency Medicine

## 2024-11-02 ENCOUNTER — Other Ambulatory Visit: Payer: Self-pay

## 2024-11-02 ENCOUNTER — Emergency Department (HOSPITAL_COMMUNITY)

## 2024-11-02 ENCOUNTER — Other Ambulatory Visit: Payer: Self-pay | Admitting: Internal Medicine

## 2024-11-02 ENCOUNTER — Encounter (HOSPITAL_COMMUNITY): Payer: Self-pay

## 2024-11-02 DIAGNOSIS — J449 Chronic obstructive pulmonary disease, unspecified: Secondary | ICD-10-CM | POA: Diagnosis not present

## 2024-11-02 DIAGNOSIS — J4489 Other specified chronic obstructive pulmonary disease: Secondary | ICD-10-CM

## 2024-11-02 DIAGNOSIS — R2231 Localized swelling, mass and lump, right upper limb: Secondary | ICD-10-CM | POA: Insufficient documentation

## 2024-11-02 MED ORDER — TRAMADOL HCL 50 MG PO TABS: 50.0000 mg | ORAL_TABLET | Freq: Four times a day (QID) | ORAL | 0 refills | Status: AC | PRN

## 2024-11-02 MED ORDER — CEPHALEXIN 500 MG PO CAPS: 500.0000 mg | ORAL_CAPSULE | Freq: Four times a day (QID) | ORAL | 0 refills | Status: AC

## 2024-11-02 MED ORDER — HYDROCODONE-ACETAMINOPHEN 5-325 MG PO TABS
1.0000 | ORAL_TABLET | Freq: Once | ORAL | Status: AC
  Administered 2024-11-02: 1 via ORAL
  Filled 2024-11-02: qty 1

## 2024-11-02 NOTE — ED Triage Notes (Signed)
 Pt arrived via POV c/o right hand pain and swelling X 1 week. Pt denies injury.

## 2024-11-02 NOTE — ED Provider Notes (Signed)
 " [place] EMERGENCY DEPARTMENT AT [facility] Provider Note  CSN: [id number] Arrival date & time: 11/02/24 1446  Chief Complaint(s) Hand Pain (Right hand)  HPI Keith Nolan is a 54 y.o. male history of COPD presenting to the emergency department with right hand pain and swelling.  Denies any trauma.  Reports it initially was red and hot, the redness has improved but still feels warm.  Has pain with moving the fingers.  Swelling present mainly to the back of the hand.  Does report history of similar episodes both in the right hand and in other joints, was told by his primary doctor I do not know what it is, but it is not gout per patient.  Denies a history of DVT.  No recent immobilization or surgery.  No swelling to other part of his arm.   Past Medical History Past Medical History:  Diagnosis Date   COPD (chronic obstructive pulmonary disease) (HCC)    DJD (degenerative joint disease)    Seasonal allergies    SHOULDER DISLOCATION-RECURRENT 10/25/2010   Qualifier: Diagnosis of  By: Margrette MD, Taft READING PATELLAR (MALALIGNMENT) 10/25/2010   Qualifier: Diagnosis of  By: Margrette MD, Stanley     Patient Active Problem List   Diagnosis Date Noted   Bursitis of left elbow 07/22/2024   Moderate episode of recurrent major depressive disorder (HCC) 11/05/2023   Fall 11/05/2023   Prediabetes 11/05/2023   Acute pain of left knee 11/05/2023   Urticaria 01/20/2023   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   Prostate cancer screening 09/18/2022   Left elbow pain 04/24/2022   Chronic heel pain 04/24/2022   Influenza vaccine refused 11/06/2021   Tubular adenoma of colon 09/18/2021   Encounter for general adult medical examination with abnormal findings 06/18/2021   Essential hypertension 06/18/2021   Anxiety 11/20/2020   PTSD (post-traumatic stress disorder) 11/20/2020   Chronic low back pain with bilateral sciatica 11/20/2020   Asthma with COPD (chronic  obstructive pulmonary disease) (HCC) 11/20/2020   Tobacco abuse 11/20/2020   Erectile dysfunction 11/20/2020   Home Medication(s) Prior to Admission medications  Medication Sig Start Date End Date Taking? Authorizing Provider  cephALEXin  (KEFLEX ) 500 MG capsule Take 1 capsule (500 mg total) by mouth 4 (four) times daily. 11/02/24  Yes Francesca Elsie LITTIE, MD  traMADol  (ULTRAM ) 50 MG tablet Take 1 tablet (50 mg total) by mouth every 6 (six) hours as needed. 11/02/24  Yes Francesca Elsie LITTIE, MD  ACCU-CHEK GUIDE TEST test strip CHECK BLOOD SUGAR 4 TIMES DAILY 02/20/24   Tobie Suzzane POUR, MD  Accu-Chek Softclix Lancets lancets CHECK BLOOD SUGAR 4 TIMES DAILY 02/20/24   Tobie Suzzane POUR, MD  albuterol  (VENTOLIN  HFA) 108 (90 Base) MCG/ACT inhaler Inhale 2 puffs into the lungs every 6 (six) hours as needed for wheezing or shortness of breath. 06/08/24   Tobie Suzzane POUR, MD  Blood Glucose Monitoring Suppl (ACCU-CHEK GUIDE) w/Device KIT TEST 4 TIMES DAILY 10/01/22   Patel, Rutwik K, MD  Blood Pressure Monitoring [facility]) MISC Check blood pressure as directed once daily. 11/05/23   Tobie Suzzane POUR, MD  BREO ELLIPTA  100-25 MCG/ACT AEPB USE 1 INHALATION BY MOUTH ONCE  DAILY AT THE SAME TIME EACH DAY 12/18/23   Tobie Suzzane POUR, MD  Elastic Bandages & Supports (KNEE BRACE) MISC Use knee brace as directed for knee pain/swelling. 11/05/23   Tobie Suzzane POUR, MD  losartan  (COZAAR ) 25 MG tablet Take 1 tablet (25  mg total) by mouth daily. 06/08/24   Tobie Suzzane POUR, MD  meloxicam  (MOBIC ) 15 MG tablet TAKE 1 TABLET BY MOUTH DAILY 08/16/24   Tobie Suzzane POUR, MD  Misc. Devices MISC Blood pressure cuff/device - wrist cuff - 1. ICD10: I10 01/20/23   Tobie Suzzane POUR, MD  Tart Cherry 400 MG CHEW Chew 1 tablet by mouth daily. 07/16/24   Tobie Suzzane POUR, MD  traZODone  (DESYREL ) 50 MG tablet TAKE 1 TABLET BY MOUTH AT  BEDTIME AS NEEDED FOR SLEEP 08/04/24   Tobie Suzzane POUR, MD                                                                                                                                     Past Surgical History Past Surgical History:  Procedure Laterality Date   COLONOSCOPY N/A 07/26/2021   Procedure: COLONOSCOPY;  Surgeon: Golda Claudis PENNER, MD;  Location: [facility];  Service: Endoscopy;  Laterality: N/A;  1:15   POLYPECTOMY  07/26/2021   Procedure: POLYPECTOMY;  Surgeon: Golda Claudis PENNER, MD;  Location: [facility];  Service: Endoscopy;;   Family History Family History  Problem Relation Age of Onset   Anxiety disorder Mother    Depression Mother    Anxiety disorder Father    Depression Father    Alcohol abuse Father    Alcohol abuse Maternal Grandfather     Social History Social History[1] Allergies Grass extracts [gramineae pollens], Molds & smuts, Naproxen , and Pollen extract-tree extract [pollen extract]  Review of Systems Review of Systems  All other systems reviewed and are negative.   Physical Exam Vital Signs  I have reviewed the triage vital signs BP (!) 158/76 (BP Location: Right Arm)   Pulse 72   Temp 99.1 F (37.3 C) (Oral)   Resp 18   Ht 5' 7 (1.702 m)   Wt 62.7 kg   SpO2 98%   BMI 21.65 kg/m  Physical Exam Vitals and nursing note reviewed.  Constitutional:      General: He is not in acute distress.    Appearance: Normal appearance.  HENT:     Head: Normocephalic and atraumatic.     Mouth/Throat:     Mouth: Mucous membranes are moist.  Eyes:     Conjunctiva/sclera: Conjunctivae normal.  Cardiovascular:     Rate and Rhythm: Normal rate.  Pulmonary:     Effort: Pulmonary effort is normal. No respiratory distress.  Abdominal:     General: Abdomen is flat.  Musculoskeletal:     Comments: Mild swelling and warmth to the dorsal right hand, pain with flexion and extension of the fingers, but no tenderness over the flexor sheath, fusiform dilation or contracture.  No deformity.  2+ radial pulse.  Trace erythema to the dorsal hand.  Skin:    General: Skin  is warm and dry.     Capillary Refill: Capillary refill takes less than 2  seconds.  Neurological:     General: No focal deficit present.     Mental Status: He is alert. Mental status is at baseline.  Psychiatric:        Mood and Affect: Mood normal.        Behavior: Behavior normal.     ED Results and Treatments Labs (all labs ordered are listed, but only abnormal results are displayed) Labs Reviewed - No data to display                                                                                                                        Radiology DG Hand Complete Right Result Date: 11/02/2024 CLINICAL DATA:  Swelling for 1 week with pain EXAM: RIGHT HAND - COMPLETE 3+ VIEW COMPARISON:  12/16/2019 FINDINGS: There is no evidence of fracture or dislocation. There is no evidence of arthropathy or other focal bone abnormality. Soft tissues are unremarkable. IMPRESSION: Negative. Electronically Signed   By: Luke Bun M.D.   On: 11/02/2024 15:43    Pertinent labs & imaging results that were available during my care of the patient were reviewed by me and considered in my medical decision making (see MDM for details).  Medications Ordered in ED Medications  HYDROcodone -acetaminophen  (NORCO/VICODIN) 5-325 MG per tablet 1 tablet (has no administration in time range)                                                                                                                                     Procedures Procedures  (including critical care time)  Medical Decision Making / ED Course   MDM:  54 year old presenting to the emergency department with hand pain.  On exam, patient has mild swelling, tenderness and erythema to dorsal hand without wound.  Could represent cellulitis, has had prior episodes of some type of inflammatory arthropathy per history but wrist joint does not seem involved. No evidence of process such as FTS or abscess. Considered other process such as DVT but no  other swelling.  No evidence of any circulatory abnormality.  Given some signs of potential cellulitis will treat for this.  Recommended close follow-up with his primary doctor, would also recommend follow-up with hand specialist.  Will discharge patient to home. All questions answered. Patient comfortable with plan of discharge. Return precautions discussed with patient and specified on the after visit summary.       Additional  history obtained:  -External records from outside source obtained and reviewed including: Chart review including previous notes, labs, imaging, consultation notes including prior visits for joint swelling    Imaging Studies ordered: I ordered imaging studies including XR hand On my interpretation imaging demonstrates no acute process I independently visualized and interpreted imaging. I agree with the radiologist interpretation   Medicines ordered and prescription drug management: Meds ordered this encounter  Medications   cephALEXin  (KEFLEX ) 500 MG capsule    Sig: Take 1 capsule (500 mg total) by mouth 4 (four) times daily.    Dispense:  28 capsule    Refill:  0   traMADol  (ULTRAM ) 50 MG tablet    Sig: Take 1 tablet (50 mg total) by mouth every 6 (six) hours as needed.    Dispense:  15 tablet    Refill:  0   HYDROcodone -acetaminophen  (NORCO/VICODIN) 5-325 MG per tablet 1 tablet    Refill:  0    -I have reviewed the patients home medicines and have made adjustments as needed    Co morbidities that complicate the patient evaluation  Past Medical History:  Diagnosis Date   COPD (chronic obstructive pulmonary disease) (HCC)    DJD (degenerative joint disease)    Seasonal allergies    SHOULDER DISLOCATION-RECURRENT 10/25/2010   Qualifier: Diagnosis of  By: Margrette MD, Taft READING PATELLAR (MALALIGNMENT) 10/25/2010   Qualifier: Diagnosis of  By: Margrette MD, Taft        Dispostion: Disposition decision including need for  hospitalization was considered, and patient discharged from emergency department.    Final Clinical Impression(s) / ED Diagnoses Final diagnoses:  Localized swelling on right hand     This chart was dictated using voice recognition software.  Despite best efforts to proofread,  errors can occur which can change the documentation meaning.     [1]  Social History Tobacco Use   Smoking status: Every Day    Current packs/day: 1.00    Average packs/day: 1 pack/day for 30.0 years (30.0 ttl pk-yrs)    Types: Cigarettes   Smokeless tobacco: Never   Tobacco comments:    provided pt with [place] Quit line information  Vaping Use   Vaping status: Never Used  Substance Use Topics   Alcohol use: No   Drug use: No     Francesca Elsie CROME, MD 11/02/24 [id number]  "

## 2024-11-02 NOTE — Telephone Encounter (Signed)
 FYI Only or Action Required?: FYI only for provider: ED advised.  Patient was last seen in primary care on 07/22/2024 by Tobie Suzzane POUR, MD.  Called Nurse Triage reporting Hand Pain and Joint Swelling.  Symptoms began a week ago.  Interventions attempted: OTC medications: Ibuprofen .  Symptoms are: gradually worsening.  Triage Disposition: Go to ED Now (Notify PCP)  Patient/caregiver understands and will follow disposition?:   Reason for Disposition  Entire hand is cool or blue in comparison to other side  Answer Assessment - Initial Assessment Questions Pt states he has swelling in the right hand by knuckles, looks bruised - onset 1 week ago. Swelling comes and goes into feet, elbows. Unable to make a fist with his hand d/t the swelling, pain 7/10. Pt taking ibuprofen . Pt states he took tramadal in the past and states that seemed to help with the swelling - requesting that his PCP send in a new script to his preferred pharmacy at the [facility] on [address]   Patient advised to go to the ED. Patient is agreeable and plans to go today if he can find a ride.   1. ONSET: When did the swelling start? (e.g., minutes, hours, days)     1 week ago 2. LOCATION: What part of the hand is swollen?  Are both hands swollen or just one hand?     Right hand - knuckles 3. TYPE : What does it look like? (e.g., ball, lump; localized; hand swelling)     Looks like bruising  4. SWELLING SEVERITY: If more than a lump or localized, ask: How bad is the hand swelling? (e.g., mild, moderate, severe; describe)     Unable to make a fist  5. REDNESS: Is there redness or signs of infection?     Pt states he had some redness to his right hand a few days ago but is no longer red.  6. PAIN: Is the swelling painful to touch? If Yes, ask: How painful is it?   (Scale 1-10; mild, moderate or severe)     7/10 7. FEVER: Do you have a fever? If Yes, ask: What is it, how was it measured, and when  did it start?      Denies  8. CAUSE: What do you think is causing the hand swelling? (e.g., heat, insect bite, pregnancy, recent injury)     Gout  9. MEDICAL HISTORY: Do you have a history of heart failure, kidney disease, liver failure, or cancer?     N/a  10. RECURRENT SYMPTOM: Have you had hand swelling before? If Yes, ask: When was the last time? What happened that time?       N/a  11. OTHER SYMPTOMS: Do you have any other symptoms? (e.g., blurred vision, difficulty breathing, headache)       Denies  Protocols used: Hand Swelling-A-AH  Copied from [id number]. Topic: Clinical - Red Word Triage >> Nov 02, 2024 12:52 PM Rosaria BRAVO wrote: Red Word that prompted transfer to Nurse Triage: Pt called reporting that his hands are swollen, painful, and he cannot make a fist. Over a week. Seeking rx that his PCP gave him previously that helped him.

## 2024-11-02 NOTE — Discharge Instructions (Addendum)
 We evaluated you for your hand swelling.  Your x-ray was negative.  You may have an infection in the skin of your hand or type of inflammation like gout.  We would recommend you follow-up with the hand specialist.  Please follow-up with Dr. Margrette.  Please also follow-up with your primary doctor. Please take Tylenol  (acetaminophen ) and Motrin  (ibuprofen ) for your symptoms at home.  You can take 1000 mg of Tylenol  every 6 hours and 600 mg of Motrin  every 6 hours as needed for your symptoms.  You can take these medicines together as needed, either at the same time, or alternating every 3 hours.  We have given you a small amount of tramadol  which you can take for pain not relieved by Tylenol  and Motrin .  Do not drink alcohol, drive, or operate machinery when taking this medicine.  Return as needed for any worsening symptoms.

## 2024-11-29 ENCOUNTER — Ambulatory Visit: Payer: Self-pay | Admitting: Internal Medicine
